# Patient Record
Sex: Female | Born: 1955 | Race: White | Hispanic: No | Marital: Married | Smoking: Never smoker
Health system: Southern US, Community
[De-identification: ages and names within clinical notes are randomized; demographics above are authoritative.]

## PROBLEM LIST (undated history)

## (undated) DIAGNOSIS — S43429A Sprain of unspecified rotator cuff capsule, initial encounter: Secondary | ICD-10-CM

## (undated) DIAGNOSIS — K852 Alcohol induced acute pancreatitis without necrosis or infection: Secondary | ICD-10-CM

## (undated) DIAGNOSIS — M7700 Medial epicondylitis, unspecified elbow: Secondary | ICD-10-CM

## (undated) DIAGNOSIS — M1990 Unspecified osteoarthritis, unspecified site: Secondary | ICD-10-CM

## (undated) DIAGNOSIS — E039 Hypothyroidism, unspecified: Secondary | ICD-10-CM

## (undated) DIAGNOSIS — E079 Disorder of thyroid, unspecified: Secondary | ICD-10-CM

## (undated) HISTORY — DX: Sprain of unspecified rotator cuff capsule, initial encounter: S43.429A

## (undated) HISTORY — PX: MOUTH SURGERY: SHX715

## (undated) HISTORY — DX: Alcohol induced acute pancreatitis without necrosis or infection: K85.20

## (undated) HISTORY — DX: Disorder of thyroid, unspecified: E07.9

## (undated) HISTORY — PX: ANKLE SURGERY: SHX546

---

## 1978-11-19 HISTORY — PX: WISDOM TOOTH EXTRACTION: SHX21

## 1990-11-19 HISTORY — PX: THYROIDECTOMY, PARTIAL: SHX18

## 1998-06-02 ENCOUNTER — Ambulatory Visit (HOSPITAL_COMMUNITY): Admission: RE | Admit: 1998-06-02 | Discharge: 1998-06-02 | Payer: Self-pay | Admitting: *Deleted

## 1999-03-29 ENCOUNTER — Other Ambulatory Visit: Admission: RE | Admit: 1999-03-29 | Discharge: 1999-03-29 | Payer: Self-pay | Admitting: Obstetrics and Gynecology

## 2001-09-25 ENCOUNTER — Encounter: Admission: RE | Admit: 2001-09-25 | Discharge: 2001-10-28 | Payer: Self-pay | Admitting: Sports Medicine

## 2001-12-24 ENCOUNTER — Encounter: Admission: RE | Admit: 2001-12-24 | Discharge: 2002-02-03 | Payer: Self-pay | Admitting: Sports Medicine

## 2002-01-21 ENCOUNTER — Other Ambulatory Visit: Admission: RE | Admit: 2002-01-21 | Discharge: 2002-01-21 | Payer: Self-pay | Admitting: Obstetrics and Gynecology

## 2003-01-07 ENCOUNTER — Encounter: Payer: Self-pay | Admitting: Obstetrics and Gynecology

## 2003-01-07 ENCOUNTER — Encounter: Admission: RE | Admit: 2003-01-07 | Discharge: 2003-01-07 | Payer: Self-pay | Admitting: Obstetrics and Gynecology

## 2005-01-02 ENCOUNTER — Encounter: Admission: RE | Admit: 2005-01-02 | Discharge: 2005-01-02 | Payer: Self-pay | Admitting: Obstetrics and Gynecology

## 2005-06-19 ENCOUNTER — Emergency Department (HOSPITAL_COMMUNITY): Admission: EM | Admit: 2005-06-19 | Discharge: 2005-06-19 | Payer: Self-pay | Admitting: Emergency Medicine

## 2005-07-02 ENCOUNTER — Inpatient Hospital Stay (HOSPITAL_COMMUNITY): Admission: RE | Admit: 2005-07-02 | Discharge: 2005-07-03 | Payer: Self-pay | Admitting: Orthopedic Surgery

## 2005-07-17 ENCOUNTER — Encounter: Admission: RE | Admit: 2005-07-17 | Discharge: 2005-10-15 | Payer: Self-pay | Admitting: Orthopedic Surgery

## 2006-04-02 ENCOUNTER — Encounter: Admission: RE | Admit: 2006-04-02 | Discharge: 2006-04-02 | Payer: Self-pay | Admitting: Obstetrics and Gynecology

## 2007-04-29 ENCOUNTER — Encounter: Admission: RE | Admit: 2007-04-29 | Discharge: 2007-04-29 | Payer: Self-pay | Admitting: Obstetrics and Gynecology

## 2008-05-04 ENCOUNTER — Encounter: Admission: RE | Admit: 2008-05-04 | Discharge: 2008-05-04 | Payer: Self-pay | Admitting: Obstetrics and Gynecology

## 2009-10-05 ENCOUNTER — Encounter (INDEPENDENT_AMBULATORY_CARE_PROVIDER_SITE_OTHER): Payer: Self-pay | Admitting: *Deleted

## 2009-10-06 ENCOUNTER — Ambulatory Visit: Payer: Self-pay | Admitting: Gastroenterology

## 2009-10-20 ENCOUNTER — Ambulatory Visit: Payer: Self-pay | Admitting: Gastroenterology

## 2009-11-01 ENCOUNTER — Encounter: Payer: Self-pay | Admitting: Gastroenterology

## 2010-11-19 DIAGNOSIS — M7700 Medial epicondylitis, unspecified elbow: Secondary | ICD-10-CM

## 2010-11-19 HISTORY — DX: Medial epicondylitis, unspecified elbow: M77.00

## 2011-04-06 NOTE — Op Note (Signed)
[name], Jimenez                ACCOUNT NO.:  [id number]   MEDICAL RECORD NO.:  [id number]          PATIENT TYPE:  INP   LOCATION:  5032                         FACILITY:  [facility]   PHYSICIAN:  Doralee Albino. Carola Frost, M.D. DATE OF BIRTH:  Mar 12, 1956   DATE OF PROCEDURE:  07/02/2005  DATE OF DISCHARGE:                                 OPERATIVE REPORT   PREOPERATIVE DIAGNOSIS:  Left ankle fracture dislocation with bimalleolar  equivalent.   POSTOPERATIVE DIAGNOSES:  1.  Left ankle fracture dislocation with bimalleolar equivalent.  2.  Ruptured syndesmosis.   PROCEDURES:  1.  Open reduction of left ankle.  2.  Open reduction and internal fixation, left lateral malleolus.  3.  Open reduction and internal fixation, left syndesmosis.   SURGEON:  Myrene Galas, M.D.   ASSISTANT:  Peter Garter, PA.   ANESTHESIA:  General.   COMPLICATIONS:  None.   TOURNIQUET:  None.   ESTIMATED BLOOD LOSS:  Less than 50 mL.   DISPOSITION:  To PACU.   CONDITION:  Stable.   BRIEF SUMMARY OF INDICATIONS FOR PROCEDURE:  [name] is a 55 year old  white female who sustained an injury to her left ankle walking down the  steps to work on June 19, 2005, resulting in immediate pain, deformity and  swelling.  She was subsequently reduced by closed methods in the emergency  room by an emergency department physician with persistent lateral  subluxation, but nonetheless, enough of a reduction that the soft tissues on  her ankle were able to undergo resolution of swelling.  She was referred to  our clinic as an outpatient where she was seen and deferred a further  reduction procedure.  At that time, her swelling was too significant to  enable early return to the OR and after approximately a 10-day reprieve for  soft tissue swelling resolution, she wished to proceed with surgical  fixation and open reduction.  She understood the risks to include nerve  injury, vessel injury, arthritis, delayed  union, nonunion and infection, and  possible need for further surgery in addition to arthritis, decreased range  of motion and thromboembolic complications.   BRIEF DESCRIPTION OF PROCEDURE:  [name] was administered a gram of Ancef  preoperatively and taken to the operating room where her left lower  extremity was prepped and draped in usual sterile fashion.  A tourniquet was  placed about her  left thigh, but not inflated during the procedure.  A  standard 6-cm incision was made over the lateral malleolus with careful  dissection proximally to avoid injury to the superficial peroneal nerve,  which was identified and retracted anteriorly.  Distally, the fascia was  split and the soft tissues elevated off the fibula while leaving intact the  periosteum.  The fracture site was then cleaned with lavage and curette, and  then reduction maneuver performed which consisted of longitudinal traction  and internal rotation of the ankle.  A lobster claw was used to hold and  maintain the reduction while fluoro-images were obtained, showing anatomic  restoration.  Two anterior-to-posterior lag screws were then placed  and a  neutralization plate along the lateral aspect of the distal fibula.  It was  secured with 6 screws, the most distal 2 being cancellus.  This resulted in  apparent anatomic restoration of the tibiotalar joint and fibula.  AP  lateral and mortise views were then obtained as well as a mortise external  rotation stress view.  Under this live fluoroscopic examination, syndesmotic  widening was clearly visible.  Consequently, one of the cortical screws was  withdrawn and additional cortex drilled and then a 40-mm cortical screw  inserted.  This resulted in restoration of the syndesmosis and a stress view  demonstrate any further motion.  Final AP lateral and mortise images were  then obtained.  The wound irrigated and closed with 2-0 and staples, and  then a sterile dressing and  posterior and stirrup splint applied.  The  patient was awakened from anesthesia and transported to PACU in stable  condition.  At the conclusion of the case, all counts were correct.   PROGNOSIS:  [name] prognosis with this injury is favorable.  She was  able to undergo apparent anatomic restoration of her fibular length,  rotation and restoration over her syndesmosis.  She is at risk for  subsequent hardware loosening or breakage, given the syndesmotic fixation.  I did discuss with her preoperatively the possibilities for treatment  including allowing the screw to break, loosen and electively withdrawing it  at 3-4 weeks.  She will be on DVT prophylaxis with Lovenox while in the  hospital and transitioned to a baby aspirin upon discharge.  She will be  strictly non-weightbearing for the first 6 weeks and then be allowed  graduated weightbearing thereafter.  She will not have any motion  restrictions after her wound heals.      Doralee Albino. Carola Frost, M.D.  Electronically Signed     [facility]MED[facility]  D:  07/02/2005  T:  07/03/2005  Job:  [id number]

## 2011-06-11 ENCOUNTER — Other Ambulatory Visit: Payer: Self-pay | Admitting: Obstetrics and Gynecology

## 2011-06-11 DIAGNOSIS — Z1231 Encounter for screening mammogram for malignant neoplasm of breast: Secondary | ICD-10-CM

## 2011-06-22 ENCOUNTER — Ambulatory Visit
Admission: RE | Admit: 2011-06-22 | Discharge: 2011-06-22 | Disposition: A | Discharge status: Home / Self Care | Payer: BC Managed Care – PPO | Source: Ambulatory Visit | Attending: Obstetrics and Gynecology | Admitting: Obstetrics and Gynecology

## 2011-06-22 DIAGNOSIS — Z1231 Encounter for screening mammogram for malignant neoplasm of breast: Secondary | ICD-10-CM

## 2011-06-26 ENCOUNTER — Other Ambulatory Visit: Payer: Self-pay | Admitting: Obstetrics and Gynecology

## 2011-06-26 DIAGNOSIS — R928 Other abnormal and inconclusive findings on diagnostic imaging of breast: Secondary | ICD-10-CM

## 2011-07-04 ENCOUNTER — Ambulatory Visit
Admission: RE | Admit: 2011-07-04 | Discharge: 2011-07-04 | Disposition: A | Discharge status: Home / Self Care | Payer: BC Managed Care – PPO | Source: Ambulatory Visit | Attending: Obstetrics and Gynecology | Admitting: Obstetrics and Gynecology

## 2011-07-04 DIAGNOSIS — R928 Other abnormal and inconclusive findings on diagnostic imaging of breast: Secondary | ICD-10-CM

## 2011-07-27 ENCOUNTER — Other Ambulatory Visit: Payer: Self-pay | Admitting: Orthopedic Surgery

## 2011-07-27 DIAGNOSIS — M25579 Pain in unspecified ankle and joints of unspecified foot: Secondary | ICD-10-CM

## 2011-07-30 ENCOUNTER — Ambulatory Visit
Admission: RE | Admit: 2011-07-30 | Discharge: 2011-07-30 | Disposition: A | Discharge status: Home / Self Care | Payer: Worker's Compensation | Source: Ambulatory Visit | Attending: Orthopedic Surgery | Admitting: Orthopedic Surgery

## 2011-07-30 DIAGNOSIS — M25579 Pain in unspecified ankle and joints of unspecified foot: Secondary | ICD-10-CM

## 2012-06-05 ENCOUNTER — Other Ambulatory Visit: Payer: Self-pay | Admitting: Obstetrics and Gynecology

## 2012-06-05 DIAGNOSIS — Z1231 Encounter for screening mammogram for malignant neoplasm of breast: Secondary | ICD-10-CM

## 2012-07-16 ENCOUNTER — Ambulatory Visit: Payer: Worker's Compensation

## 2012-07-22 ENCOUNTER — Ambulatory Visit
Admission: RE | Admit: 2012-07-22 | Discharge: 2012-07-22 | Disposition: A | Discharge status: Home / Self Care | Payer: BC Managed Care – PPO | Source: Ambulatory Visit | Attending: Obstetrics and Gynecology | Admitting: Obstetrics and Gynecology

## 2012-07-22 DIAGNOSIS — Z1231 Encounter for screening mammogram for malignant neoplasm of breast: Secondary | ICD-10-CM

## 2013-03-12 ENCOUNTER — Other Ambulatory Visit: Payer: Self-pay | Admitting: Surgery

## 2013-06-29 ENCOUNTER — Other Ambulatory Visit: Payer: Self-pay

## 2013-06-29 DIAGNOSIS — Z1231 Encounter for screening mammogram for malignant neoplasm of breast: Secondary | ICD-10-CM

## 2013-07-14 ENCOUNTER — Other Ambulatory Visit: Payer: Self-pay | Admitting: Orthopaedic Surgery

## 2013-07-14 DIAGNOSIS — M25552 Pain in left hip: Secondary | ICD-10-CM

## 2013-07-14 DIAGNOSIS — R531 Weakness: Secondary | ICD-10-CM

## 2013-07-15 ENCOUNTER — Ambulatory Visit
Admission: RE | Admit: 2013-07-15 | Discharge: 2013-07-15 | Disposition: A | Discharge status: Home / Self Care | Payer: BC Managed Care – PPO | Source: Ambulatory Visit | Attending: Orthopaedic Surgery | Admitting: Orthopaedic Surgery

## 2013-07-15 DIAGNOSIS — R531 Weakness: Secondary | ICD-10-CM

## 2013-07-15 DIAGNOSIS — M25552 Pain in left hip: Secondary | ICD-10-CM

## 2013-07-29 ENCOUNTER — Ambulatory Visit: Payer: BC Managed Care – PPO

## 2013-08-12 ENCOUNTER — Ambulatory Visit
Admission: RE | Admit: 2013-08-12 | Discharge: 2013-08-12 | Disposition: A | Discharge status: Home / Self Care | Payer: BC Managed Care – PPO | Source: Ambulatory Visit

## 2013-08-12 DIAGNOSIS — Z1231 Encounter for screening mammogram for malignant neoplasm of breast: Secondary | ICD-10-CM

## 2013-11-19 HISTORY — PX: COLONOSCOPY: SHX174

## 2014-08-18 ENCOUNTER — Encounter: Payer: Self-pay | Admitting: Gastroenterology

## 2014-08-26 ENCOUNTER — Encounter: Payer: Self-pay | Admitting: Gastroenterology

## 2014-10-21 ENCOUNTER — Ambulatory Visit (AMBULATORY_SURGERY_CENTER): Payer: Self-pay

## 2014-10-21 VITALS — Ht 61.0 in | Wt 156.2 lb

## 2014-10-21 DIAGNOSIS — Z86010 Personal history of colonic polyps: Secondary | ICD-10-CM

## 2014-10-21 MED ORDER — MOVIPREP 100 G PO SOLR: ORAL | Status: DC

## 2014-10-21 NOTE — Progress Notes (Signed)
Per pt, no allergies to soy or egg products.Pt not taking any weight loss meds or using  O2 at home. 

## 2014-11-04 ENCOUNTER — Ambulatory Visit (AMBULATORY_SURGERY_CENTER): Payer: BC Managed Care – PPO | Admitting: Gastroenterology

## 2014-11-04 ENCOUNTER — Encounter: Payer: Self-pay | Admitting: Gastroenterology

## 2014-11-04 VITALS — BP 128/78 | HR 78 | Temp 98.8°F | Resp 26 | Ht 61.0 in | Wt 156.0 lb

## 2014-11-04 DIAGNOSIS — D125 Benign neoplasm of sigmoid colon: Secondary | ICD-10-CM

## 2014-11-04 DIAGNOSIS — Z860100 Personal history of colon polyps, unspecified: Secondary | ICD-10-CM

## 2014-11-04 DIAGNOSIS — K635 Polyp of colon: Secondary | ICD-10-CM

## 2014-11-04 MED ORDER — SODIUM CHLORIDE 0.9 % IV SOLN: 500.0000 mL | INTRAVENOUS | Status: DC

## 2014-11-04 NOTE — Patient Instructions (Signed)
Findings:  Polyps, Mild Diverticulosis Recommendations:  High fiber Diet, Repeat colonoscopy in 5 years.  YOU HAD AN ENDOSCOPIC PROCEDURE TODAY AT [facility]: Refer to the procedure report that was given to you for any specific questions about what was found during the examination.  If the procedure report does not answer your questions, please call your gastroenterologist to clarify.  If you requested that your care partner not be given the details of your procedure findings, then the procedure report has been included in a sealed envelope for you to review at your convenience later.  YOU SHOULD EXPECT: Some feelings of bloating in the abdomen. Passage of more gas than usual.  Walking can help get rid of the air that was put into your GI tract during the procedure and reduce the bloating. If you had a lower endoscopy (such as a colonoscopy or flexible sigmoidoscopy) you may notice spotting of blood in your stool or on the toilet paper. If you underwent a bowel prep for your procedure, then you may not have a normal bowel movement for a few days.  DIET: Your first meal following the procedure should be a light meal and then it is ok to progress to your normal diet.  A half-sandwich or bowl of soup is an example of a good first meal.  Heavy or fried foods are harder to digest and may make you feel nauseous or bloated.  Likewise meals heavy in dairy and vegetables can cause extra gas to form and this can also increase the bloating.  Drink plenty of fluids but you should avoid alcoholic beverages for 24 hours.  ACTIVITY: Your care partner should take you home directly after the procedure.  You should plan to take it easy, moving slowly for the rest of the day.  You can resume normal activity the day after the procedure however you should NOT DRIVE or use heavy machinery for 24 hours (because of the sedation medicines used during the test).    SYMPTOMS TO REPORT IMMEDIATELY: A  gastroenterologist can be reached at any hour.  During normal business hours, 8:30 AM to 5:00 PM Monday through Friday, call [phone].  After hours and on weekends, please call the GI answering service at [phone] who will take a message and have the physician on call contact you.   Following lower endoscopy (colonoscopy or flexible sigmoidoscopy):  Excessive amounts of blood in the stool  Significant tenderness or worsening of abdominal pains  Swelling of the abdomen that is new, acute  Fever of 100F or higher  Following upper endoscopy (EGD)  Vomiting of blood or coffee ground material  New chest pain or pain under the shoulder blades  Painful or persistently difficult swallowing  New shortness of breath  Fever of 100F or higher  Black, tarry-looking stoolsPlease follow all discharge instructions given to you by the recovery room nurse. If you have any questions or problems after discharge please call one of the numbers listed above. You will receive a phone call in the am to see how you are doing and answer any questions you may have. Thank you for choosing [facility] for your health care needs. FOLLOW UP: If any biopsies were taken you will be contacted by phone or by letter within the next 1-3 weeks.  Call your gastroenterologist if you have not heard about the biopsies in 3 weeks.  Our staff will call the home number listed on your records the next business day  following your procedure to check on you and address any questions or concerns that you may have at that time regarding the information given to you following your procedure. This is a courtesy call and so if there is no answer at the home number and we have not heard from you through the emergency physician on call, we will assume that you have returned to your regular daily activities without incident.  SIGNATURES/CONFIDENTIALITY: You and/or your care partner have signed paperwork which will be entered  into your electronic medical record.  These signatures attest to the fact that that the information above on your After Visit Summary has been reviewed and is understood.  Full responsibility of the confidentiality of this discharge information lies with you and/or your care-partner.

## 2014-11-04 NOTE — Progress Notes (Signed)
Called to room to assist during endoscopic procedure.  Patient ID and intended procedure confirmed with present staff. Received instructions for my participation in the procedure from the performing physician.  

## 2014-11-04 NOTE — Op Note (Signed)
[place]  [address] [place], [id number]   COLONOSCOPY PROCEDURE REPORT  PATIENT: [name], [name]  MR#: [id number] BIRTHDATE: 03/12/1956 , 71  yrs. old GENDER: female ENDOSCOPIST: Ladene Artist, MD, [facility] PROCEDURE DATE:  11/04/2014 PROCEDURE:   Colonoscopy with snare polypectomy First Screening Colonoscopy - Avg.  risk and is 50 yrs.  old or older - No.  Prior Negative Screening - Now for repeat screening. N/A  History of Adenoma - Now for follow-up colonoscopy & has been > or = to 3 yrs.  Yes hx of adenoma.  Has been 3 or more years since last colonoscopy.  Polyps Removed Today? Yes. ASA CLASS:   Class II INDICATIONS:surveillance colonoscopy based on a history of adenomatous colonic polyp(s). MEDICATIONS: Monitored anesthesia care and Propofol 350 mg IV DESCRIPTION OF PROCEDURE:   After the risks benefits and alternatives of the procedure were thoroughly explained, informed consent was obtained.  The digital rectal exam revealed no abnormalities of the rectum.   The LB [id number]  endoscope was introduced through the anus and advanced to the cecum, which was identified by both the appendix and ileocecal valve. No adverse events experienced.   The quality of the prep was excellent, using MoviPrep  The instrument was then slowly withdrawn as the colon was fully examined.  COLON FINDINGS: Two sessile polyps measuring 6 mm in size were found in the sigmoid colon.  A polypectomy was performed with a cold snare.  The resection was complete, the polyp tissue was completely retrieved and sent to histology.   There was mild diverticulosis noted in the sigmoid colon.   The examination was otherwise normal. Retroflexed views revealed no abnormalities. The time to cecum=2 minutes 23 seconds.  Withdrawal time=10 minutes 39 seconds.  The scope was withdrawn and the procedure completed. COMPLICATIONS: There were no immediate complications.  ENDOSCOPIC  IMPRESSION: 1.   Two sessile polyps in the sigmoid colon; polypectomy performed with a cold snare 2.   Mild diverticulosis in the sigmoid colon  RECOMMENDATIONS: 1.  Await pathology results 2.  High fiber diet with liberal fluid intake. 3.  Repeat Colonoscopy in 5 years.  eSigned:  Ladene Artist, MD, [facility] 11/04/2014 8:33 AM

## 2014-11-04 NOTE — Progress Notes (Signed)
A/ox3 pleased with MAC, report to Tracy W 

## 2014-11-05 ENCOUNTER — Telehealth: Payer: Self-pay | Admitting: *Deleted

## 2014-11-05 NOTE — Telephone Encounter (Signed)
Message left

## 2014-11-15 ENCOUNTER — Encounter: Payer: Self-pay | Admitting: Gastroenterology

## 2014-12-22 ENCOUNTER — Other Ambulatory Visit: Payer: Self-pay | Admitting: Physician Assistant

## 2014-12-22 DIAGNOSIS — M25561 Pain in right knee: Secondary | ICD-10-CM

## 2014-12-22 DIAGNOSIS — M25562 Pain in left knee: Secondary | ICD-10-CM

## 2014-12-28 ENCOUNTER — Ambulatory Visit
Admission: RE | Admit: 2014-12-28 | Discharge: 2014-12-28 | Disposition: A | Discharge status: Home / Self Care | Payer: BLUE CROSS/BLUE SHIELD | Source: Ambulatory Visit | Attending: Physician Assistant | Admitting: Physician Assistant

## 2014-12-28 DIAGNOSIS — M25562 Pain in left knee: Secondary | ICD-10-CM

## 2015-01-03 ENCOUNTER — Other Ambulatory Visit: Payer: Self-pay

## 2015-03-25 ENCOUNTER — Encounter: Payer: Self-pay | Admitting: Gastroenterology

## 2015-10-26 ENCOUNTER — Other Ambulatory Visit (HOSPITAL_COMMUNITY): Payer: Self-pay | Admitting: Orthopaedic Surgery

## 2015-10-26 NOTE — Patient Instructions (Addendum)
[name]  10/26/2015   Your procedure is scheduled on: Friday 11/04/15     Report to [facility]  Entrance take [facility]  elevators to 3rd floor to  [place] at   100pm  Call this number if you have problems the morning of surgery [phone]   Remember: ONLY 1 PERSON MAY GO WITH YOU TO SHORT STAY TO GET  READY MORNING OF YOUR SURGERY.  Do not eat food after midnite.  May have clear liquids from 12 midnite until 0830am morning of surgery then nothing by mouth.       Take these medicines the morning of surgery with A SIP OF WATER: Synthroid                                 You may not have any metal on your body including hair pins and              piercings  Do not wear jewelry, make-up, lotions, powders or perfumes, deodorant             Do not wear nail polish.  Do not shave  48 hours prior to surgery.                 Do not bring valuables to the hospital. [place].  Contacts, dentures or bridgework may not be worn into surgery.  Leave suitcase in the car. After surgery it may be brought to your room.         Special Instructions: deep breathing exercises, leg exercises               Please read over the following fact sheets you were given: _____________________________________________________________________             [facility] - Preparing for Surgery Before surgery, you can play an important role.  Because skin is not sterile, your skin needs to be as free of germs as possible.  You can reduce the number of germs on your skin by washing with CHG (chlorahexidine gluconate) soap before surgery.  CHG is an antiseptic cleaner which kills germs and bonds with the skin to continue killing germs even after washing. Please DO NOT use if you have an allergy to CHG or antibacterial soaps.  If your skin becomes reddened/irritated stop using the CHG and inform your nurse when you arrive at  Short Stay. Do not shave (including legs and underarms) for at least 48 hours prior to the first CHG shower.  You may shave your face/neck. Please follow these instructions carefully:  1.  Shower with CHG Soap the night before surgery and the  morning of Surgery.  2.  If you choose to wash your hair, wash your hair first as usual with your  normal  shampoo.  3.  After you shampoo, rinse your hair and body thoroughly to remove the  shampoo.                           4.  Use CHG as you would any other liquid soap.  You can apply chg directly  to the skin and wash  Gently with a scrungie or clean washcloth.  5.  Apply the CHG Soap to your body ONLY FROM THE NECK DOWN.   Do not use on face/ open                           Wound or open sores. Avoid contact with eyes, ears mouth and genitals (private parts).                       Wash face,  Genitals (private parts) with your normal soap.             6.  Wash thoroughly, paying special attention to the area where your surgery  will be performed.  7.  Thoroughly rinse your body with warm water from the neck down.  8.  DO NOT shower/wash with your normal soap after using and rinsing off  the CHG Soap.                9.  Pat yourself dry with a clean towel.            10.  Wear clean pajamas.            11.  Place clean sheets on your bed the night of your first shower and do not  sleep with pets. Day of Surgery : Do not apply any lotions/deodorants the morning of surgery.  Please wear clean clothes to the hospital/surgery center.  FAILURE TO FOLLOW THESE INSTRUCTIONS MAY RESULT IN THE CANCELLATION OF YOUR SURGERY PATIENT SIGNATURE_________________________________  NURSE SIGNATURE__________________________________  ________________________________________________________________________  WHAT IS A BLOOD TRANSFUSION? Blood Transfusion Information  A transfusion is the replacement of blood or some of its parts. Blood is made up  of multiple cells which provide different functions.  Red blood cells carry oxygen and are used for blood loss replacement.  White blood cells fight against infection.  Platelets control bleeding.  Plasma helps clot blood.  Other blood products are available for specialized needs, such as hemophilia or other clotting disorders. BEFORE THE TRANSFUSION  Who gives blood for transfusions?   Healthy volunteers who are fully evaluated to make sure their blood is safe. This is blood bank blood. Transfusion therapy is the safest it has ever been in the practice of medicine. Before blood is taken from a donor, a complete history is taken to make sure that person has no history of diseases nor engages in risky social behavior (examples are intravenous drug use or sexual activity with multiple partners). The donor's travel history is screened to minimize risk of transmitting infections, such as malaria. The donated blood is tested for signs of infectious diseases, such as HIV and hepatitis. The blood is then tested to be sure it is compatible with you in order to minimize the chance of a transfusion reaction. If you or a relative donates blood, this is often done in anticipation of surgery and is not appropriate for emergency situations. It takes many days to process the donated blood. RISKS AND COMPLICATIONS Although transfusion therapy is very safe and saves many lives, the main dangers of transfusion include:   Getting an infectious disease.  Developing a transfusion reaction. This is an allergic reaction to something in the blood you were given. Every precaution is taken to prevent this. The decision to have a blood transfusion has been considered carefully by your caregiver before blood is given. Blood is not given unless the benefits outweigh  the risks. AFTER THE TRANSFUSION  Right after receiving a blood transfusion, you will usually feel much better and more energetic. This is especially true  if your red blood cells have gotten low (anemic). The transfusion raises the level of the red blood cells which carry oxygen, and this usually causes an energy increase.  The nurse administering the transfusion will monitor you carefully for complications. HOME CARE INSTRUCTIONS  No special instructions are needed after a transfusion. You may find your energy is better. Speak with your caregiver about any limitations on activity for underlying diseases you may have. SEEK MEDICAL CARE IF:   Your condition is not improving after your transfusion.  You develop redness or irritation at the intravenous (IV) site. SEEK IMMEDIATE MEDICAL CARE IF:  Any of the following symptoms occur over the next 12 hours:  Shaking chills.  You have a temperature by mouth above 102 F (38.9 C), not controlled by medicine.  Chest, back, or muscle pain.  People around you feel you are not acting correctly or are confused.  Shortness of breath or difficulty breathing.  Dizziness and fainting.  You get a rash or develop hives.  You have a decrease in urine output.  Your urine turns a dark color or changes to pink, red, or brown. Any of the following symptoms occur over the next 10 days:  You have a temperature by mouth above 102 F (38.9 C), not controlled by medicine.  Shortness of breath.  Weakness after normal activity.  The white part of the eye turns yellow (jaundice).  You have a decrease in the amount of urine or are urinating less often.  Your urine turns a dark color or changes to pink, red, or brown. Document Released: 11/02/2000 Document Revised: 01/28/2012 Document Reviewed: 06/21/2008 ExitCare Patient Information 2014 ExitCare, [place].  _______________________________________________________________________   CLEAR LIQUID DIET   Foods Allowed                                                                     Foods Excluded  Coffee and tea, regular and decaf                              liquids that you cannot  Plain Jell-O in any flavor                                             see through such as: Fruit ices (not with fruit pulp)                                     milk, soups, orange juice  Iced Popsicles                                    All solid food Carbonated beverages, regular and diet  Cranberry, grape and apple juices Sports drinks like Gatorade Lightly seasoned clear broth or consume(fat free) Sugar, honey syrup  Sample Menu Breakfast                                Lunch                                     Supper Cranberry juice                    Beef broth                            Chicken broth Jell-O                                     Grape juice                           Apple juice Coffee or tea                        Jell-O                                      Popsicle                                                Coffee or tea                        Coffee or tea  _____________________________________________________________________

## 2015-10-28 ENCOUNTER — Encounter (HOSPITAL_COMMUNITY): Payer: Self-pay

## 2015-10-28 ENCOUNTER — Encounter (HOSPITAL_COMMUNITY)
Admission: RE | Admit: 2015-10-28 | Discharge: 2015-10-28 | Disposition: A | Discharge status: Home / Self Care | Payer: BLUE CROSS/BLUE SHIELD | Source: Ambulatory Visit | Attending: Orthopaedic Surgery | Admitting: Orthopaedic Surgery

## 2015-10-28 DIAGNOSIS — Z01812 Encounter for preprocedural laboratory examination: Secondary | ICD-10-CM | POA: Insufficient documentation

## 2015-10-28 HISTORY — DX: Medial epicondylitis, unspecified elbow: M77.00

## 2015-10-28 HISTORY — DX: Hypothyroidism, unspecified: E03.9

## 2015-10-28 HISTORY — DX: Unspecified osteoarthritis, unspecified site: M19.90

## 2015-10-28 LAB — CBC
HEMATOCRIT: 41.6 % (ref 36.0–46.0)
HEMOGLOBIN: 13.7 g/dL (ref 12.0–15.0)
MCH: 30.9 pg (ref 26.0–34.0)
MCHC: 32.9 g/dL (ref 30.0–36.0)
MCV: 93.7 fL (ref 78.0–100.0)
Platelets: 218 10*3/uL (ref 150–400)
RBC: 4.44 MIL/uL (ref 3.87–5.11)
RDW: 12.2 % (ref 11.5–15.5)
WBC: 4.7 10*3/uL (ref 4.0–10.5)

## 2015-10-28 LAB — PROTIME-INR
INR: 1.03 (ref 0.00–1.49)
Prothrombin Time: 13.7 seconds (ref 11.6–15.2)

## 2015-10-28 LAB — BASIC METABOLIC PANEL
ANION GAP: 7 (ref 5–15)
BUN: 11 mg/dL (ref 6–20)
CHLORIDE: 104 mmol/L (ref 101–111)
CO2: 30 mmol/L (ref 22–32)
Calcium: 9.9 mg/dL (ref 8.9–10.3)
Creatinine, Ser: 0.74 mg/dL (ref 0.44–1.00)
GFR calc Af Amer: >60 mL/min (ref >60)
GLUCOSE: 129 mg/dL — AB (ref 65–99)
POTASSIUM: 4.6 mmol/L (ref 3.5–5.1)
Sodium: 141 mmol/L (ref 135–145)

## 2015-10-28 LAB — SURGICAL PCR SCREEN
MRSA, PCR: NEGATIVE
STAPHYLOCOCCUS AUREUS: POSITIVE — AB

## 2015-10-28 LAB — APTT: aPTT: 26 seconds (ref 24–37)

## 2015-10-28 LAB — TYPE AND SCREEN
ABO/RH(D): B POS
Antibody Screen: NEGATIVE

## 2015-10-28 LAB — ABO/RH: ABO/RH(D): B POS

## 2015-10-28 NOTE — Progress Notes (Signed)
Pt positive for staph. Called in prescription, notified pt, notified Dr. Ninfa Linden.

## 2015-11-03 ENCOUNTER — Encounter (HOSPITAL_COMMUNITY): Payer: Self-pay | Admitting: Certified Registered Nurse Anesthetist

## 2015-11-04 ENCOUNTER — Ambulatory Visit (HOSPITAL_COMMUNITY)
Admission: RE | Admit: 2015-11-04 | Discharge: 2015-11-04 | Disposition: A | Discharge status: Home / Self Care | Payer: BLUE CROSS/BLUE SHIELD | Source: Ambulatory Visit | Attending: Orthopaedic Surgery | Admitting: Orthopaedic Surgery

## 2015-11-04 ENCOUNTER — Encounter (HOSPITAL_COMMUNITY): Payer: Self-pay | Admitting: *Deleted

## 2015-11-04 ENCOUNTER — Encounter (HOSPITAL_COMMUNITY)
Admission: RE | Disposition: A | Discharge status: Home / Self Care | Payer: Self-pay | Source: Ambulatory Visit | Attending: Orthopaedic Surgery

## 2015-11-04 DIAGNOSIS — M179 Osteoarthritis of knee, unspecified: Secondary | ICD-10-CM | POA: Insufficient documentation

## 2015-11-04 DIAGNOSIS — E039 Hypothyroidism, unspecified: Secondary | ICD-10-CM | POA: Insufficient documentation

## 2015-11-04 DIAGNOSIS — Z79899 Other long term (current) drug therapy: Secondary | ICD-10-CM | POA: Insufficient documentation

## 2015-11-04 DIAGNOSIS — M1712 Unilateral primary osteoarthritis, left knee: Secondary | ICD-10-CM

## 2015-11-04 DIAGNOSIS — Z539 Procedure and treatment not carried out, unspecified reason: Secondary | ICD-10-CM | POA: Insufficient documentation

## 2015-11-04 SURGERY — ARTHROPLASTY, KNEE, UNICOMPARTMENTAL
Anesthesia: General | Site: Knee | Laterality: Left

## 2015-11-04 MED ORDER — CEFAZOLIN SODIUM-DEXTROSE 2-3 GM-% IV SOLR: 2.0000 g | INTRAVENOUS | Status: DC

## 2015-11-04 NOTE — Anesthesia Preprocedure Evaluation (Deleted)
Anesthesia Evaluation  Patient identified by MRN, date of birth, ID band Patient awake    Reviewed: Allergy & Precautions, NPO status , Patient's Chart, lab work & pertinent test results  Airway Mallampati: II   Neck ROM: full    Dental   Pulmonary neg pulmonary ROS,    breath sounds clear to auscultation       Cardiovascular negative cardio ROS   Rhythm:regular Rate:Normal     Neuro/Psych    GI/Hepatic   Endo/Other  Hypothyroidism   Renal/GU      Musculoskeletal  (+) Arthritis ,   Abdominal   Peds  Hematology   Anesthesia Other Findings   Reproductive/Obstetrics                             Anesthesia Physical Anesthesia Plan  ASA: II  Anesthesia Plan: MAC and Spinal   Post-op Pain Management:    Induction: Intravenous  Airway Management Planned: Simple Face Mask  Additional Equipment:   Intra-op Plan:   Post-operative Plan:   Informed Consent: I have reviewed the patients History and Physical, chart, labs and discussed the procedure including the risks, benefits and alternatives for the proposed anesthesia with the patient or authorized representative who has indicated his/her understanding and acceptance.     Plan Discussed with: CRNA, Anesthesiologist and Surgeon  Anesthesia Plan Comments:         Anesthesia Quick Evaluation

## 2015-11-04 NOTE — H&P (Signed)
TOTAL KNEE ADMISSION H&P  Patient is being admitted for left partial knee arthroplasty.  Subjective:  Chief Complaint:left knee pain.  HPI: [name], 59 y.o. female, has a history of pain and functional disability in the left knee due to arthritis and has failed non-surgical conservative treatments for greater than 12 weeks to includeNSAID's and/or analgesics, corticosteriod injections, viscosupplementation injections, flexibility and strengthening excercises and activity modification.  Onset of symptoms was gradual, starting 4 years ago with gradually worsening course since that time. The patient noted prior procedures on the knee to include  arthroscopy on the left knee(s).  Patient currently rates pain in the left knee(s) at 10 out of 10 with activity. Patient has night pain, worsening of pain with activity and weight bearing, pain that interferes with activities of daily living, pain with passive range of motion, crepitus and joint swelling.  Patient has evidence of subchondral sclerosis, periarticular osteophytes and joint space narrowing of the medial compartment by imaging studies. There is no active infection.  Patient Active Problem List   Diagnosis Date Noted  . Osteoarthritis of left knee medial compartment 11/04/2015   Past Medical History  Diagnosis Date  . Thyroid disease   . Rotator cuff sprain   . Hypothyroidism   . Arthritis   . Golfer's elbow 2012    Past Surgical History  Procedure Laterality Date  . Cesarean section  1978     1 time  . Ankle surgery      screw in left ankle/ 2 surgeries  . Thyroidectomy, partial  1992  . Wisdom tooth extraction  1980  . Mouth surgery      No prescriptions prior to admission   No Known Allergies  Social History  Substance Use Topics  . Smoking status: Never Smoker   . Smokeless tobacco: Never Used  . Alcohol Use: 4.2 oz/week    7 Standard drinks or equivalent per week     Comment: socially, weekends    Family  History  Problem Relation Age of Onset  . Diverticulitis Mother   . Diverticulitis Son      Review of Systems  Musculoskeletal: Positive for joint pain.  All other systems reviewed and are negative.   Objective:  Physical Exam  Constitutional: She is oriented to person, place, and time. She appears well-developed and well-nourished.  HENT:  Head: Normocephalic and atraumatic.  Eyes: EOM are normal. Pupils are equal, round, and reactive to light.  Neck: Normal range of motion. Neck supple.  Cardiovascular: Normal rate and regular rhythm.   Respiratory: Effort normal and breath sounds normal.  GI: Soft. Bowel sounds are normal.  Musculoskeletal:       Left knee: She exhibits decreased range of motion, swelling and effusion. Tenderness found. Medial joint line tenderness noted.  Neurological: She is alert and oriented to person, place, and time.  Skin: Skin is warm and dry.  Psychiatric: She has a normal mood and affect.    Vital signs in last 24 hours:    Labs:   Estimated body mass index is 29.30 kg/(m^2) as calculated from the following:   Height as of 11/04/14: 5\' 1"  (1.549 m).   Weight as of 12/28/14: 70.308 kg (155 lb).   Imaging Review Plain radiographs demonstrate moderate degenerative joint disease of the left knee medial compartment. The overall alignment ismild varus. The bone quality appears to be excellent for age and reported activity level.  Assessment/Plan:  End stage arthritis, left knee medial compartment  The  patient history, physical examination, clinical judgment of the provider and imaging studies are consistent with end stage degenerative joint disease of the left knee(s) and partial knee arthroplasty is deemed medically necessary. The treatment options including medical management, injection therapy arthroscopy and arthroplasty were discussed at length. The risks and benefits of partial knee arthroplasty were presented and reviewed. The risks due to  aseptic loosening, infection, stiffness, patella tracking problems, thromboembolic complications and other imponderables were discussed. The patient acknowledged the explanation, agreed to proceed with the plan and consent was signed. Patient is being admitted for inpatient treatment for surgery, pain control, PT, OT, prophylactic antibiotics, VTE prophylaxis, progressive ambulation and ADL's and discharge planning. The patient is planning to be discharged home with home health services

## 2015-11-07 ENCOUNTER — Encounter (HOSPITAL_COMMUNITY): Payer: Self-pay | Admitting: *Deleted

## 2015-11-07 ENCOUNTER — Ambulatory Visit: Payer: BLUE CROSS/BLUE SHIELD | Admitting: Physical Therapy

## 2015-11-07 ENCOUNTER — Other Ambulatory Visit (HOSPITAL_COMMUNITY): Payer: Self-pay | Admitting: Orthopaedic Surgery

## 2015-11-07 DIAGNOSIS — M179 Osteoarthritis of knee, unspecified: Secondary | ICD-10-CM | POA: Diagnosis not present

## 2015-11-08 ENCOUNTER — Other Ambulatory Visit (HOSPITAL_COMMUNITY): Payer: Self-pay | Admitting: *Deleted

## 2015-11-09 ENCOUNTER — Ambulatory Visit: Payer: BLUE CROSS/BLUE SHIELD | Admitting: Physical Therapy

## 2015-11-09 ENCOUNTER — Observation Stay (HOSPITAL_COMMUNITY): Payer: BLUE CROSS/BLUE SHIELD

## 2015-11-09 ENCOUNTER — Ambulatory Visit (HOSPITAL_COMMUNITY): Payer: BLUE CROSS/BLUE SHIELD | Admitting: Certified Registered Nurse Anesthetist

## 2015-11-09 ENCOUNTER — Encounter (HOSPITAL_COMMUNITY)
Admission: RE | Disposition: A | Discharge status: Home / Self Care | Payer: Self-pay | Source: Ambulatory Visit | Attending: Orthopaedic Surgery

## 2015-11-09 ENCOUNTER — Observation Stay (HOSPITAL_COMMUNITY)
Admission: RE | Admit: 2015-11-09 | Discharge: 2015-11-10 | Disposition: A | Discharge status: Home / Self Care | Payer: BLUE CROSS/BLUE SHIELD | Source: Ambulatory Visit | Attending: Orthopaedic Surgery | Admitting: Orthopaedic Surgery

## 2015-11-09 ENCOUNTER — Encounter (HOSPITAL_COMMUNITY): Payer: Self-pay

## 2015-11-09 DIAGNOSIS — E669 Obesity, unspecified: Secondary | ICD-10-CM | POA: Diagnosis not present

## 2015-11-09 DIAGNOSIS — M179 Osteoarthritis of knee, unspecified: Secondary | ICD-10-CM | POA: Diagnosis not present

## 2015-11-09 DIAGNOSIS — E039 Hypothyroidism, unspecified: Secondary | ICD-10-CM | POA: Insufficient documentation

## 2015-11-09 DIAGNOSIS — Z6830 Body mass index (BMI) 30.0-30.9, adult: Secondary | ICD-10-CM | POA: Insufficient documentation

## 2015-11-09 DIAGNOSIS — Z96652 Presence of left artificial knee joint: Secondary | ICD-10-CM

## 2015-11-09 DIAGNOSIS — M1712 Unilateral primary osteoarthritis, left knee: Secondary | ICD-10-CM | POA: Diagnosis present

## 2015-11-09 HISTORY — PX: PARTIAL KNEE ARTHROPLASTY: SHX2174

## 2015-11-09 LAB — TYPE AND SCREEN
ABO/RH(D): B POS
ANTIBODY SCREEN: NEGATIVE

## 2015-11-09 SURGERY — ARTHROPLASTY, KNEE, UNICOMPARTMENTAL
Anesthesia: Spinal | Site: Knee | Laterality: Left

## 2015-11-09 MED ORDER — MIDAZOLAM HCL 5 MG/5ML IJ SOLN
INTRAMUSCULAR | Status: DC | PRN
  Administered 2015-11-09: 2 mg via INTRAVENOUS

## 2015-11-09 MED ORDER — OXYCODONE HCL 5 MG PO TABS
5.0000 mg | ORAL_TABLET | ORAL | Status: DC | PRN
  Administered 2015-11-09 – 2015-11-10 (×6): 10 mg via ORAL
  Filled 2015-11-09 (×6): qty 2

## 2015-11-09 MED ORDER — CEFAZOLIN SODIUM-DEXTROSE 2-3 GM-% IV SOLR
INTRAVENOUS | Status: AC
  Filled 2015-11-09: qty 50

## 2015-11-09 MED ORDER — HYDROMORPHONE HCL 1 MG/ML IJ SOLN: 0.2500 mg | INTRAMUSCULAR | Status: DC | PRN

## 2015-11-09 MED ORDER — PROPOFOL 10 MG/ML IV BOLUS
INTRAVENOUS | Status: AC
  Filled 2015-11-09: qty 20

## 2015-11-09 MED ORDER — 0.9 % SODIUM CHLORIDE (POUR BTL) OPTIME
TOPICAL | Status: DC | PRN
  Administered 2015-11-09: 1000 mL

## 2015-11-09 MED ORDER — ACETAMINOPHEN 325 MG PO TABS: 650.0000 mg | ORAL_TABLET | Freq: Four times a day (QID) | ORAL | Status: DC | PRN

## 2015-11-09 MED ORDER — ACETAMINOPHEN 650 MG RE SUPP: 650.0000 mg | Freq: Four times a day (QID) | RECTAL | Status: DC | PRN

## 2015-11-09 MED ORDER — ONDANSETRON HCL 4 MG/2ML IJ SOLN
INTRAMUSCULAR | Status: DC | PRN
  Administered 2015-11-09: 4 mg via INTRAVENOUS

## 2015-11-09 MED ORDER — EPHEDRINE SULFATE 50 MG/ML IJ SOLN
INTRAMUSCULAR | Status: AC
  Filled 2015-11-09: qty 1

## 2015-11-09 MED ORDER — ASPIRIN EC 325 MG PO TBEC
325.0000 mg | DELAYED_RELEASE_TABLET | Freq: Two times a day (BID) | ORAL | Status: DC
  Administered 2015-11-09 – 2015-11-10 (×2): 325 mg via ORAL
  Filled 2015-11-09 (×4): qty 1

## 2015-11-09 MED ORDER — FENTANYL CITRATE (PF) 100 MCG/2ML IJ SOLN
INTRAMUSCULAR | Status: DC | PRN
  Administered 2015-11-09 (×2): 50 ug via INTRAVENOUS

## 2015-11-09 MED ORDER — ONDANSETRON HCL 4 MG/2ML IJ SOLN: 4.0000 mg | Freq: Four times a day (QID) | INTRAMUSCULAR | Status: DC | PRN

## 2015-11-09 MED ORDER — EPHEDRINE SULFATE 50 MG/ML IJ SOLN
INTRAMUSCULAR | Status: DC | PRN
  Administered 2015-11-09 (×2): 10 mg via INTRAVENOUS

## 2015-11-09 MED ORDER — MIDAZOLAM HCL 2 MG/2ML IJ SOLN
INTRAMUSCULAR | Status: AC
  Filled 2015-11-09: qty 2

## 2015-11-09 MED ORDER — HYDROMORPHONE HCL 1 MG/ML IJ SOLN
1.0000 mg | INTRAMUSCULAR | Status: DC | PRN
  Administered 2015-11-09 – 2015-11-10 (×4): 1 mg via INTRAVENOUS
  Filled 2015-11-09 (×4): qty 1

## 2015-11-09 MED ORDER — PHENOL 1.4 % MT LIQD
1.0000 | OROMUCOSAL | Status: DC | PRN
  Filled 2015-11-09: qty 177

## 2015-11-09 MED ORDER — METHOCARBAMOL 500 MG PO TABS
500.0000 mg | ORAL_TABLET | Freq: Four times a day (QID) | ORAL | Status: DC | PRN
  Administered 2015-11-10: 500 mg via ORAL
  Filled 2015-11-09 (×2): qty 1

## 2015-11-09 MED ORDER — OXYCODONE HCL 5 MG/5ML PO SOLN: 5.0000 mg | Freq: Once | ORAL | Status: DC | PRN

## 2015-11-09 MED ORDER — SODIUM CHLORIDE 0.9 % IJ SOLN
INTRAMUSCULAR | Status: AC
  Filled 2015-11-09: qty 10

## 2015-11-09 MED ORDER — DEXAMETHASONE SODIUM PHOSPHATE 10 MG/ML IJ SOLN
INTRAMUSCULAR | Status: AC
  Filled 2015-11-09: qty 1

## 2015-11-09 MED ORDER — PROPOFOL 10 MG/ML IV BOLUS
INTRAVENOUS | Status: AC
Start: 2015-11-09 — End: 2015-11-09
  Filled 2015-11-09: qty 20

## 2015-11-09 MED ORDER — KETOROLAC TROMETHAMINE 15 MG/ML IJ SOLN
7.5000 mg | Freq: Four times a day (QID) | INTRAMUSCULAR | Status: AC
  Administered 2015-11-09 (×2): 7.5 mg via INTRAVENOUS
  Filled 2015-11-09 (×3): qty 1

## 2015-11-09 MED ORDER — ONDANSETRON HCL 4 MG/2ML IJ SOLN
INTRAMUSCULAR | Status: AC
  Filled 2015-11-09: qty 2

## 2015-11-09 MED ORDER — METOCLOPRAMIDE HCL 10 MG PO TABS: 5.0000 mg | ORAL_TABLET | Freq: Three times a day (TID) | ORAL | Status: DC | PRN

## 2015-11-09 MED ORDER — PROPOFOL 500 MG/50ML IV EMUL
INTRAVENOUS | Status: DC | PRN
  Administered 2015-11-09: 100 ug/kg/min via INTRAVENOUS

## 2015-11-09 MED ORDER — CEFAZOLIN SODIUM 1-5 GM-% IV SOLN
1.0000 g | Freq: Four times a day (QID) | INTRAVENOUS | Status: AC
  Administered 2015-11-09 (×2): 1 g via INTRAVENOUS
  Filled 2015-11-09 (×2): qty 50

## 2015-11-09 MED ORDER — METOCLOPRAMIDE HCL 5 MG/ML IJ SOLN: 5.0000 mg | Freq: Three times a day (TID) | INTRAMUSCULAR | Status: DC | PRN

## 2015-11-09 MED ORDER — DIPHENHYDRAMINE HCL 12.5 MG/5ML PO ELIX: 12.5000 mg | ORAL_SOLUTION | ORAL | Status: DC | PRN

## 2015-11-09 MED ORDER — BUPIVACAINE IN DEXTROSE 0.75-8.25 % IT SOLN
INTRATHECAL | Status: DC | PRN
  Administered 2015-11-09: 1.6 mL via INTRATHECAL

## 2015-11-09 MED ORDER — DEXAMETHASONE SODIUM PHOSPHATE 10 MG/ML IJ SOLN
INTRAMUSCULAR | Status: DC | PRN
  Administered 2015-11-09: 10 mg via INTRAVENOUS

## 2015-11-09 MED ORDER — FENTANYL CITRATE (PF) 100 MCG/2ML IJ SOLN
INTRAMUSCULAR | Status: AC
  Filled 2015-11-09: qty 2

## 2015-11-09 MED ORDER — ZOLPIDEM TARTRATE 5 MG PO TABS: 5.0000 mg | ORAL_TABLET | Freq: Every evening | ORAL | Status: DC | PRN

## 2015-11-09 MED ORDER — OXYCODONE HCL 5 MG PO TABS: 5.0000 mg | ORAL_TABLET | Freq: Once | ORAL | Status: DC | PRN

## 2015-11-09 MED ORDER — ONDANSETRON HCL 4 MG/2ML IJ SOLN
4.0000 mg | Freq: Four times a day (QID) | INTRAMUSCULAR | Status: DC | PRN
  Administered 2015-11-10: 4 mg via INTRAVENOUS
  Filled 2015-11-09: qty 2

## 2015-11-09 MED ORDER — LACTATED RINGERS IV SOLN
INTRAVENOUS | Status: DC | PRN
  Administered 2015-11-09 (×2): via INTRAVENOUS

## 2015-11-09 MED ORDER — ONDANSETRON HCL 4 MG PO TABS: 4.0000 mg | ORAL_TABLET | Freq: Four times a day (QID) | ORAL | Status: DC | PRN

## 2015-11-09 MED ORDER — ALUM & MAG HYDROXIDE-SIMETH 200-200-20 MG/5ML PO SUSP: 30.0000 mL | ORAL | Status: DC | PRN

## 2015-11-09 MED ORDER — FENTANYL CITRATE (PF) 250 MCG/5ML IJ SOLN
INTRAMUSCULAR | Status: AC
  Filled 2015-11-09: qty 5

## 2015-11-09 MED ORDER — CEFAZOLIN SODIUM-DEXTROSE 2-3 GM-% IV SOLR
2.0000 g | INTRAVENOUS | Status: AC
  Administered 2015-11-09: 2 g via INTRAVENOUS

## 2015-11-09 MED ORDER — MENTHOL 3 MG MT LOZG: 1.0000 | LOZENGE | OROMUCOSAL | Status: DC | PRN

## 2015-11-09 MED ORDER — SODIUM CHLORIDE 0.9 % IR SOLN
Status: DC | PRN
  Administered 2015-11-09: 1000 mL

## 2015-11-09 MED ORDER — PROMETHAZINE HCL 25 MG/ML IJ SOLN: 6.2500 mg | INTRAMUSCULAR | Status: DC | PRN

## 2015-11-09 MED ORDER — METHOCARBAMOL 1000 MG/10ML IJ SOLN
500.0000 mg | Freq: Four times a day (QID) | INTRAVENOUS | Status: DC | PRN
  Administered 2015-11-09: 500 mg via INTRAVENOUS
  Filled 2015-11-09 (×2): qty 5

## 2015-11-09 MED ORDER — LEVOTHYROXINE SODIUM 100 MCG PO TABS
100.0000 ug | ORAL_TABLET | Freq: Every day | ORAL | Status: DC
  Administered 2015-11-10: 100 ug via ORAL
  Filled 2015-11-09 (×2): qty 1

## 2015-11-09 SURGICAL SUPPLY — 47 items
APL SKNCLS STERI-STRIP NONHPOA (GAUZE/BANDAGES/DRESSINGS) ×1
BAG SPEC THK2 15X12 ZIP CLS (MISCELLANEOUS)
BAG ZIPLOCK 12X15 (MISCELLANEOUS) IMPLANT
BANDAGE ACE 6X5 VEL STRL LF (GAUZE/BANDAGES/DRESSINGS) ×3 IMPLANT
BANDAGE ELASTIC 6 VELCRO ST LF (GAUZE/BANDAGES/DRESSINGS) ×2 IMPLANT
BENZOIN TINCTURE PRP APPL 2/3 (GAUZE/BANDAGES/DRESSINGS) ×2 IMPLANT
BLADE SAW RECIPROCATING 77.5 (BLADE) ×3 IMPLANT
BLADE SAW SGTL 13.0X1.19X90.0M (BLADE) ×3 IMPLANT
BONE CEMENT PALACOSE (Orthopedic Implant) ×3 IMPLANT
BOWL SMART MIX CTS (DISPOSABLE) ×3 IMPLANT
CAPT KNEE PARTIAL 2 ×2 IMPLANT
CEMENT BONE PALACOSE (Orthopedic Implant) IMPLANT
CLOSURE WOUND 1/2 X4 (GAUZE/BANDAGES/DRESSINGS) ×1
CLOTH BEACON ORANGE TIMEOUT ST (SAFETY) ×3 IMPLANT
CUFF TOURN SGL QUICK 34 (TOURNIQUET CUFF) ×3
CUFF TRNQT CYL 34X4X40X1 (TOURNIQUET CUFF) ×1 IMPLANT
DRAPE U-SHAPE 47X51 STRL (DRAPES) ×3 IMPLANT
DRSG AQUACEL AG ADV 3.5X10 (GAUZE/BANDAGES/DRESSINGS) IMPLANT
DRSG PAD ABDOMINAL 8X10 ST (GAUZE/BANDAGES/DRESSINGS) ×6 IMPLANT
DURAPREP 26ML APPLICATOR (WOUND CARE) ×3 IMPLANT
ELECT REM PT RETURN 9FT ADLT (ELECTROSURGICAL) ×3
ELECTRODE REM PT RTRN 9FT ADLT (ELECTROSURGICAL) ×1 IMPLANT
GAUZE SPONGE 4X4 12PLY STRL (GAUZE/BANDAGES/DRESSINGS) ×3 IMPLANT
GAUZE XEROFORM 1X8 LF (GAUZE/BANDAGES/DRESSINGS) IMPLANT
GLOVE BIO SURGEON STRL SZ7.5 (GLOVE) ×3 IMPLANT
GLOVE BIOGEL PI IND STRL 8 (GLOVE) ×2 IMPLANT
GLOVE BIOGEL PI INDICATOR 8 (GLOVE) ×4
GLOVE ECLIPSE 8.0 STRL XLNG CF (GLOVE) ×3 IMPLANT
GOWN STRL REUS W/TWL XL LVL3 (GOWN DISPOSABLE) ×6 IMPLANT
HANDPIECE INTERPULSE COAX TIP (DISPOSABLE) ×3
LEGGING LITHOTOMY PAIR STRL (DRAPES) ×3 IMPLANT
MANIFOLD NEPTUNE II (INSTRUMENTS) ×3 IMPLANT
PACK BLADE SAW RECIP 70 3 PT (BLADE) ×2 IMPLANT
PACK TOTAL KNEE CUSTOM (KITS) ×3 IMPLANT
PAD ABD 8X10 STRL (GAUZE/BANDAGES/DRESSINGS) ×2 IMPLANT
PADDING CAST COTTON 6X4 STRL (CAST SUPPLIES) ×3 IMPLANT
SET HNDPC FAN SPRY TIP SCT (DISPOSABLE) ×1 IMPLANT
STAPLER VISISTAT 35W (STAPLE) IMPLANT
STRIP CLOSURE SKIN 1/2X4 (GAUZE/BANDAGES/DRESSINGS) ×1 IMPLANT
SUT MNCRL AB 4-0 PS2 18 (SUTURE) IMPLANT
SUT VIC AB 0 CT1 36 (SUTURE) IMPLANT
SUT VIC AB 1 CT1 36 (SUTURE) ×3 IMPLANT
SUT VIC AB 2-0 CT1 27 (SUTURE) ×6
SUT VIC AB 2-0 CT1 TAPERPNT 27 (SUTURE) ×2 IMPLANT
TRAY FOLEY W/METER SILVER 14FR (SET/KITS/TRAYS/PACK) ×1 IMPLANT
TRAY FOLEY W/METER SILVER 16FR (SET/KITS/TRAYS/PACK) ×1 IMPLANT
WRAP KNEE MAXI GEL POST OP (GAUZE/BANDAGES/DRESSINGS) ×3 IMPLANT

## 2015-11-09 NOTE — H&P (Signed)
  No change from last week's H&P 5 days ago.  Surgery was canceled due to physician issues and not patient issues.  She understand fully that we are proceeding with a left partial knee replacement today to address her left knee medial compartment arthritis.  Risks and benefits have been discussed in detail and informed consent is obtained.  Again, no changes from H&P done 5 days ago.

## 2015-11-09 NOTE — Anesthesia Procedure Notes (Signed)
Spinal Patient location during procedure: OR Start time: 11/09/2015 8:40 AM End time: 11/09/2015 8:45 AM Staffing Anesthesiologist: Franne Grip Resident/CRNA: Lupita Raider F Performed by: resident/CRNA  Preanesthetic Checklist Completed: patient identified, site marked, surgical consent, pre-op evaluation, timeout performed, IV checked, risks and benefits discussed and monitors and equipment checked Spinal Block Patient position: sitting Prep: Betadine and site prepped and draped Patient monitoring: heart rate, cardiac monitor, continuous pulse ox and blood pressure Approach: midline Location: L3-4 Injection technique: single-shot Needle Needle type: Sprotte  Needle gauge: 24 G Needle length: 5 cm Assessment Sensory level: T6 Additional Notes Patient positioned sitting. Sterile prep and drape. SAB placed without difficulty. [facility] NEEDLE TRAY Lot # [id number] exp 2017-03-18

## 2015-11-09 NOTE — Anesthesia Postprocedure Evaluation (Signed)
Anesthesia Post Note  Patient: [name]  Procedure(s) Performed: Procedure(s) (LRB): LEFT UNICOMPARTMENTAL KNEE ARTHROPLASTY (Left)  Patient location during evaluation: PACU Anesthesia Type: Spinal Level of consciousness: oriented and awake and alert Pain management: pain level controlled Vital Signs Assessment: post-procedure vital signs reviewed and stable Respiratory status: spontaneous breathing, respiratory function stable and patient connected to nasal cannula oxygen Cardiovascular status: blood pressure returned to baseline and stable Postop Assessment: no headache, no backache and spinal receding Anesthetic complications: no    Last Vitals:  Filed Vitals:   11/09/15 1229 11/09/15 1350  BP: 129/77 142/75  Pulse: 86 95  Temp: 36.9 C 37.3 C  Resp: 16 18    Last Pain:  Filed Vitals:   11/09/15 1356  PainSc: 6                  Angelin Cutrone J

## 2015-11-09 NOTE — Op Note (Signed)
[name], [name] NO.:  [id number]  MEDICAL RECORD NO.:  [id number]  LOCATION:  [facility]                         FACILITY:  [facility]  PHYSICIAN:  Lind Guest. Ninfa Linden, M.D.DATE OF BIRTH:  02/01/1956  DATE OF PROCEDURE:  11/09/2015 DATE OF DISCHARGE:                              OPERATIVE REPORT   PREOPERATIVE DIAGNOSIS:  Left knee medial compartment osteoarthritis.  POSTOPERATIVE DIAGNOSIS:  Left knee medial compartment osteoarthritis.  PROCEDURE:  Left unicompartmental/partial knee arthroplasty.  IMPLANTS:  Biomet Oxford unicompartmental knee with size small femur, size left A tibial tray, 3 mm mobile-bearing polyethylene insert.  SURGEON:  Lind Guest. Ninfa Linden, M.D.  ASSISTING:  Erskine Emery, PA-C  ANESTHESIA:  Spinal.  TOURNIQUET TIME:  Less than hour and a half.  ANTIBIOTICS:  2 g of IV Ancef.  BLOOD LOSS:  Less than 100 mL.  COMPLICATIONS:  None.  INDICATIONS:  [name] is a 59 year old female well known to me.  She has medial compartment arthritis of her left knee.  This has become quite painful.  She has tried and failed all forms of conservative treatment including multiple injections and an arthroscopic intervention.  She still has recurrent effusions in her knee and pain is daily, it is affecting her mobility, her quality of life, and her activities of daily living to the point that she does wish to proceed with a partial knee arthroplasty.  She understands fully the risks and benefits of this surgery including the risk of acute blood anemia, nerve or vessel injury, fracture, infection, dislocation, and implant failure.  She understands her goals are decreased pain, improved mobility, and overall improved quality of life.  PROCEDURE DESCRIPTION:  After informed consent was obtained and her left leg was signed, she was brought to the operating room and placed supine on the operating table.  She was then sat up and spinal anesthesia  was obtained.  She was then placed supine again.  Her right nonoperative leg was placed in a well leg holder.  A nonsterile tourniquet was placed around her upper thigh of her left leg and her left leg was placed in a well leg holder underneath the thigh.  The left leg was then prepped and draped from the thigh down the ankle with DuraPrep and sterile drapes including sterile stockinette.  With the bed raised, time-out was called and she identified as correct patient and correct left knee.  We then used the Esmarch around the leg and tourniquet was inflated to 300 mm of pressure.  We then made a medial incision just medial to patellar tendon and carried this down to the tibial tubercle.  We dissected down the knee joint and carried out a medial parapatellar arthrotomy finding a large joint effusion.  We then found significant cartilage changes on the medial compartment of her knee but intact ACL and PCL, minimal and no cartilage changes on the lateral side and minimal at the patellofemoral joint.  We then first proceeded with cleaning the remnants of the meniscus from her knee.  We then set our extramedullary cutting guide after making our tibial cut, based off the axis of the knee and the tibial spine as well as the  ankle and second ray.  We made this cut without difficulty using a sagittal and reciprocating saw.  We then did our femoral sizing, based off a small spoon and that is how we made our tibial cut as well.  We then put our distal femoral cutting guide and made that distal femoral cut without difficulty, we chose for a size small femur.  We then went back to the tibia and placed a trial insert for the tibia with the trial insert small for the femur and this was A for the tibia.  We trialed 3-4 polyethylene insert and we were pleased how it was in extension, we went to flexion, we then ended up balancing it with a 5 spigot.  We milled for this as well and then, we were pleased  with the implant motion with the size 3 polyethylene insert with a small femur and A left tibial tray.  We put her through range of motion.  We were pleased with the stability.  We then did our finishing cuts on the femur and the tibia and opened up the real components.  We mixed our cement and irrigated the knee with normal saline solution using pulsatile lavage.  We then cemented the real Biomet Oxford tibial tray size A left followed by the real small femur.  We then compressed our cement using a 4-mm trial insert and once cement had hardened, we cleaned cement, debris from the knee, and we placed the real 3-mm mobile bearing polyethylene insert.  We then irrigated the knee again with normal saline solution using pulsatile lavage.  We closed the arthrotomy with interrupted #1 Vicryl suture followed by 0 Vicryl in the deep tissue, 2-0 Vicryl in subcutaneous tissues, 4-0 Monocryl subcuticular stitch and Steri-Strips on the skin.  Well-padded sterile dressing was applied, and she was taken to the recovery room in stable condition. All final counts were correct.  There were no complications noted.  Of note, Erskine Emery, PA-C, assisted during the entire case.  His assistance was crucial for facilitating all aspects of this case.     Lind Guest. Ninfa Linden, M.D.     CYB/MEDQ  D:  11/09/2015  T:  11/09/2015  Job:  [id number]

## 2015-11-09 NOTE — Anesthesia Preprocedure Evaluation (Addendum)
Anesthesia Evaluation  Patient identified by MRN, date of birth, ID band Patient awake    Reviewed: Allergy & Precautions, NPO status , Patient's Chart, lab work & pertinent test results  Airway Mallampati: II  TM Distance: >3 FB Neck ROM: Full    Dental no notable dental hx.    Pulmonary neg pulmonary ROS,    Pulmonary exam normal breath sounds clear to auscultation       Cardiovascular negative cardio ROS Normal cardiovascular exam Rhythm:Regular Rate:Normal     Neuro/Psych negative neurological ROS  negative psych ROS   GI/Hepatic negative GI ROS, Neg liver ROS,   Endo/Other  Hypothyroidism   Renal/GU negative Renal ROS  negative genitourinary   Musculoskeletal  (+) Arthritis ,   Abdominal (+) + obese,   Peds negative pediatric ROS (+)  Hematology negative hematology ROS (+)   Anesthesia Other Findings   Reproductive/Obstetrics negative OB ROS                             Anesthesia Physical Anesthesia Plan  ASA: II  Anesthesia Plan: Spinal   Post-op Pain Management:    Induction: Intravenous  Airway Management Planned: Natural Airway  Additional Equipment:   Intra-op Plan:   Post-operative Plan: Extubation in OR  Informed Consent: I have reviewed the patients History and Physical, chart, labs and discussed the procedure including the risks, benefits and alternatives for the proposed anesthesia with the patient or authorized representative who has indicated his/her understanding and acceptance.   Dental advisory given  Plan Discussed with: CRNA  Anesthesia Plan Comments: (Discussed risks and benefits of and differences between spinal and general. Discussed risks of spinal including headache, backache, failure, bleeding and hematoma, infection, and nerve damage. Patient consents to spinal. Questions answered. Coagulation studies and platelet count acceptable.)        Anesthesia Quick Evaluation

## 2015-11-09 NOTE — Evaluation (Signed)
Physical Therapy Evaluation Patient Details Name: [name] MRN: [id number] DOB: [birth year] Today's Date: 11/09/2015   History of Present Illness  L UKR  Clinical Impression  Pt s/p L UKR presents with decreased L LE strength/ROM and post op pain limiting functional mobility.  Pt should progress well to dc home with family assist and would benefit from follow up [facility].    Follow Up Recommendations Home health PT    Equipment Recommendations  None recommended by PT    Recommendations for Other Services OT consult     Precautions / Restrictions Precautions Precautions: Fall Restrictions Weight Bearing Restrictions: No Other Position/Activity Restrictions: WBAT      Mobility  Bed Mobility Overal bed mobility: Needs Assistance Bed Mobility: Supine to Sit;Sit to Supine     Supine to sit: Min guard Sit to supine: Min guard   General bed mobility comments: cues for sequence and use of R LE to self assist  Transfers Overall transfer level: Needs assistance Equipment used: Rolling walker (2 wheeled) Transfers: Sit to/from Stand Sit to Stand: Min assist         General transfer comment: cues for LE management and use of UEs to self assist  Ambulation/Gait Ambulation/Gait assistance: Min assist Ambulation Distance (Feet): 40 Feet Assistive device: Rolling walker (2 wheeled) Gait Pattern/deviations: Shuffle;Trunk flexed;Decreased step length - right;Decreased step length - left;Step-to pattern;Step-through pattern     General Gait Details: cues for posture, position from RW and initial sequence  Stairs            Wheelchair Mobility    Modified Rankin (Stroke Patients Only)       Balance                                             Pertinent Vitals/Pain Pain Assessment: 0-10 Pain Score: 6  Pain Location: L knee Pain Descriptors / Indicators: Aching;Sore Pain Intervention(s): Limited activity within patient's  tolerance;Monitored during session;Premedicated before session;Ice applied    Home Living Family/patient expects to be discharged to:: Private residence Living Arrangements: Spouse/significant other Available Help at Discharge: Family Type of Home: House Home Access: Stairs to enter Entrance Stairs-Rails: Right Entrance Stairs-Number of Steps: 5 Home Layout: Two level;Able to live on main level with bedroom/bathroom Home Equipment: Toilet riser;Walker - 2 wheels;Shower seat      Prior Function Level of Independence: Independent               Hand Dominance        Extremity/Trunk Assessment   Upper Extremity Assessment: Overall WFL for tasks assessed           Lower Extremity Assessment: LLE deficits/detail   LLE Deficits / Details: Able to perform IND SLR, AAROM ltd to 30 flex 2* pain  Cervical / Trunk Assessment: Normal  Communication   Communication: No difficulties  Cognition Arousal/Alertness: Awake/alert Behavior During Therapy: WFL for tasks assessed/performed Overall Cognitive Status: Within Functional Limits for tasks assessed                      General Comments      Exercises Total Joint Exercises Ankle Circles/Pumps: AROM;Both;15 reps;Supine Quad Sets: AROM;Both;5 reps;Supine Heel Slides: AAROM;5 reps;Supine;Left Straight Leg Raises: AROM;Left;10 reps;Supine      Assessment/Plan    PT Assessment Patient needs continued PT services  PT Diagnosis Difficulty walking  PT Problem List Decreased strength;Decreased range of motion;Decreased activity tolerance;Decreased mobility;Decreased knowledge of use of DME;Pain  PT Treatment Interventions DME instruction;Gait training;Stair training;Functional mobility training;Therapeutic activities;Therapeutic exercise;Patient/family education   PT Goals (Current goals can be found in the Care Plan section) Acute Rehab PT Goals Patient Stated Goal: Regain IND PT Goal Formulation: With  patient Time For Goal Achievement: 11/11/15 Potential to Achieve Goals: Good    Frequency 7X/week   Barriers to discharge        Co-evaluation               End of Session Equipment Utilized During Treatment: Gait belt Activity Tolerance: Patient limited by fatigue;Patient limited by pain Patient left: in bed;with call bell/phone within reach Nurse Communication: Mobility status         Time: [id number] PT Time Calculation (min) (ACUTE ONLY): 26 min   Charges:   PT Evaluation $Initial PT Evaluation Tier I: 1 Procedure PT Treatments $Gait Training: 8-22 mins   PT G Codes:        Eugean Arnott November 21, 2015, 4:14 PM

## 2015-11-09 NOTE — Transfer of Care (Signed)
Immediate Anesthesia Transfer of Care Note  Patient: [name]  Procedure(s) Performed: Procedure(s): LEFT UNICOMPARTMENTAL KNEE ARTHROPLASTY (Left)  Patient Location: PACU  Anesthesia Type:MAC and Spinal  Level of Consciousness: awake, alert  and oriented  Airway & Oxygen Therapy: Patient Spontanous Breathing and Patient connected to nasal cannula oxygen  Post-op Assessment: Report given to RN and Post -op Vital signs reviewed and stable  Post vital signs: Reviewed and stable  Last Vitals:  Filed Vitals:   11/09/15 0635  BP: 133/89  Pulse: 80  Temp: 37 C  Resp: 18    Complications: No apparent anesthesia complications

## 2015-11-09 NOTE — Brief Op Note (Signed)
11/09/2015  10:23 AM  PATIENT:  [name]  59 y.o. female  PRE-OPERATIVE DIAGNOSIS:  left knee medial compartment osteoarthritis  POST-OPERATIVE DIAGNOSIS:  left knee medial compartment osteoarthritis  PROCEDURE:  Procedure(s): LEFT UNICOMPARTMENTAL KNEE ARTHROPLASTY (Left)  SURGEON:  Surgeon(s) and Role:    * Mcarthur Rossetti, MD - Primary  PHYSICIAN ASSISTANT: Benita Stabile, PA-C  ANESTHESIA:   spinal  EBL:  Total I/O In: -  Out: 100 [Blood:100]  BLOOD ADMINISTERED:none  DRAINS: none   LOCAL MEDICATIONS USED:  NONE  SPECIMEN:  No Specimen  DISPOSITION OF SPECIMEN:  N/A  COUNTS:  YES  TOURNIQUET:   Total Tourniquet Time Documented: Thigh (Left) - 77 minutes Total: Thigh (Left) - 77 minutes   DICTATION: .Other Dictation: Dictation Number [id number]  PLAN OF CARE: Admit to inpatient   PATIENT DISPOSITION:  PACU - hemodynamically stable.   Delay start of Pharmacological VTE agent (>24hrs) due to surgical blood loss or risk of bleeding: no

## 2015-11-10 ENCOUNTER — Encounter (HOSPITAL_COMMUNITY): Payer: Self-pay | Admitting: Orthopaedic Surgery

## 2015-11-10 DIAGNOSIS — M179 Osteoarthritis of knee, unspecified: Secondary | ICD-10-CM | POA: Diagnosis not present

## 2015-11-10 MED ORDER — OXYCODONE HCL 5 MG PO TABS: 5.0000 mg | ORAL_TABLET | ORAL | Status: DC | PRN

## 2015-11-10 MED ORDER — ASPIRIN 325 MG PO TBEC: 325.0000 mg | DELAYED_RELEASE_TABLET | Freq: Two times a day (BID) | ORAL | Status: DC

## 2015-11-10 MED ORDER — METHOCARBAMOL 500 MG PO TABS: 500.0000 mg | ORAL_TABLET | Freq: Four times a day (QID) | ORAL | Status: DC | PRN

## 2015-11-10 NOTE — Discharge Summary (Signed)
Patient ID: [name] MRN: [id number] DOB/AGE: 59/17/1957 59 y.o.  Admit date: 11/09/2015 Discharge date: 11/10/2015  Admission Diagnoses:  Principal Problem:   Osteoarthritis of left knee medial compartment Active Problems:   Status post left partial knee replacement   Discharge Diagnoses:  Same  Past Medical History  Diagnosis Date  . Thyroid disease   . Rotator cuff sprain   . Hypothyroidism   . Arthritis   . Golfer's elbow 2012    Surgeries: Procedure(s): LEFT UNICOMPARTMENTAL KNEE ARTHROPLASTY on 11/09/2015   Consultants:    Discharged Condition: Improved  Hospital Course: [name] is an 59 y.o. female who was admitted 11/09/2015 for operative treatment ofOsteoarthritis of left knee. Patient has severe unremitting pain that affects sleep, daily activities, and work/hobbies. After pre-op clearance the patient was taken to the operating room on 11/09/2015 and underwent  Procedure(s): LEFT UNICOMPARTMENTAL KNEE ARTHROPLASTY.    Patient was given perioperative antibiotics: Anti-infectives    Start     Dose/Rate Route Frequency Ordered Stop   11/09/15 1400  ceFAZolin (ANCEF) IVPB 1 g/50 mL premix     1 g 100 mL/hr over 30 Minutes Intravenous Every 6 hours 11/09/15 1142 11/09/15 2040   11/09/15 0712  ceFAZolin (ANCEF) IVPB 2 g/50 mL premix     2 g 100 mL/hr over 30 Minutes Intravenous On call to O.R. 11/09/15 [id number] 11/09/15 0859       Patient was given sequential compression devices, early ambulation, and chemoprophylaxis to prevent DVT.  Patient benefited maximally from hospital stay and there were no complications.    Recent vital signs: Patient Vitals for the past 24 hrs:  BP Temp Temp src Pulse Resp SpO2  11/10/15 0935 118/70 mmHg 97.9 F (36.6 C) Oral 88 16 98 %  11/10/15 0600 121/60 mmHg 97.8 F (36.6 C) Oral 79 15 95 %  11/10/15 0230 (!) 108/57 mmHg 97.9 F (36.6 C) Oral 86 14 95 %  11/09/15 2200 128/77 mmHg 98 F (36.7 C) Oral 84 16 97 %      Recent laboratory studies: No results for input(s): WBC, HGB, HCT, PLT, NA, K, CL, CO2, BUN, CREATININE, GLUCOSE, INR, CALCIUM in the last 72 hours.  Invalid input(s): PT, 2   Discharge Medications:     Medication List    TAKE these medications        aspirin 325 MG EC tablet  Take 1 tablet (325 mg total) by mouth 2 (two) times daily after a meal.     levothyroxine 100 MCG tablet  Commonly known as:  SYNTHROID, LEVOTHROID  Take 100 mcg by mouth daily before breakfast.     methocarbamol 500 MG tablet  Commonly known as:  ROBAXIN  Take 1 tablet (500 mg total) by mouth every 6 (six) hours as needed for muscle spasms.     oxyCODONE 5 MG immediate release tablet  Commonly known as:  Oxy IR/ROXICODONE  Take 1-2 tablets (5-10 mg total) by mouth every 4 (four) hours as needed for breakthrough pain.        Diagnostic Studies: Dg Knee Left Port  11/09/2015  CLINICAL DATA:  Partial knee replacement EXAM: PORTABLE LEFT KNEE - 1-2 VIEW COMPARISON:  MRI 12/28/2014 FINDINGS: Medial compartment arthroplasty. Components grossly well positioned. No radiographic complication evident. Moderate-sized joint effusion. Soft tissue air consistent with the recent surgery. IMPRESSION: Medial compartment arthroplasty. No radiographic complication. Patient does have a moderate joint effusion. Electronically Signed   By: Nelson Chimes M.D.   On:  11/09/2015 11:22    Disposition: 01-Home or Self Care      Discharge Instructions    Call MD / Call 911    Complete by:  As directed   If you experience chest pain or shortness of breath, CALL 911 and be transported to the hospital emergency room.  If you develope a fever above 101 F, pus (white drainage) or increased drainage or redness at the wound, or calf pain, call your surgeon's office.     Constipation Prevention    Complete by:  As directed   Drink plenty of fluids.  Prune juice may be helpful.  You may use a stool softener, such as Colace (over  the counter) 100 mg twice a day.  Use MiraLax (over the counter) for constipation as needed.     Diet - low sodium heart healthy    Complete by:  As directed      Discharge patient    Complete by:  As directed      Increase activity slowly as tolerated    Complete by:  As directed            Follow-up Information    Follow up with Mcarthur Rossetti, MD In 2 weeks.   Specialty:  Orthopedic Surgery   Contact information:   [place] [address] [phone]       Follow up with [facility].   Why:  physical therapy   Contact information:   [address] [place]  [address] [phone]        Signed: Mcarthur Rossetti 11/10/2015, 7:10 PM

## 2015-11-10 NOTE — Progress Notes (Signed)
Physical Therapy Treatment Patient Details Name: [name] MRN: [id number] DOB: June 07, 1956 Today's Date: 11/10/2015    History of Present Illness L UKR    PT Comments    Pt progressing with mobility but continues to c/o dizziness with mobility - BP 144/82  Follow Up Recommendations  Home health PT     Equipment Recommendations  None recommended by PT    Recommendations for Other Services OT consult     Precautions / Restrictions Precautions Precautions: Fall Restrictions Weight Bearing Restrictions: No Other Position/Activity Restrictions: WBAT    Mobility  Bed Mobility Overal bed mobility: Needs Assistance Bed Mobility: Supine to Sit     Supine to sit: Min guard     General bed mobility comments: cues for sequence and use of R LE to self assist  Transfers Overall transfer level: Needs assistance Equipment used: Rolling walker (2 wheeled) Transfers: Sit to/from Stand Sit to Stand: Min guard         General transfer comment: cues for LE management and use of UEs to self assist  Ambulation/Gait Ambulation/Gait assistance: Min assist Ambulation Distance (Feet): 50 Feet (and 20) Assistive device: Rolling walker (2 wheeled) Gait Pattern/deviations: Step-to pattern;Step-through pattern;Decreased step length - right;Decreased step length - left;Shuffle;Trunk flexed Gait velocity: decreased 2* c/o dizziness   General Gait Details: cues for posture, position from RW and initial sequence   Stairs            Wheelchair Mobility    Modified Rankin (Stroke Patients Only)       Balance                                    Cognition Arousal/Alertness: Awake/alert Behavior During Therapy: WFL for tasks assessed/performed Overall Cognitive Status: Within Functional Limits for tasks assessed                      Exercises Total Joint Exercises Ankle Circles/Pumps: AROM;Both;15 reps;Supine Quad Sets: AROM;Both;Supine;10  reps Heel Slides: AAROM;Supine;Left;10 reps Straight Leg Raises: Left;10 reps;Supine;AAROM;AROM    General Comments        Pertinent Vitals/Pain Pain Assessment: 0-10 Pain Score: 6  Pain Location: L knee Pain Descriptors / Indicators: Aching;Sore Pain Intervention(s): Limited activity within patient's tolerance;Monitored during session;Premedicated before session;Ice applied    Home Living                      Prior Function            PT Goals (current goals can now be found in the care plan section) Acute Rehab PT Goals Patient Stated Goal: Regain IND PT Goal Formulation: With patient Time For Goal Achievement: 11/11/15 Potential to Achieve Goals: Good Progress towards PT goals: Progressing toward goals    Frequency  7X/week    PT Plan Current plan remains appropriate    Co-evaluation             End of Session Equipment Utilized During Treatment: Gait belt Activity Tolerance: Patient limited by fatigue;Patient limited by pain;Other (comment) (and dizziness - BP 144/82) Patient left: in chair;with call bell/phone within reach     Time: 1030-1100 PT Time Calculation (min) (ACUTE ONLY): 30 min  Charges:  $Gait Training: 8-22 mins $Therapeutic Exercise: 8-22 mins                    G Codes:  Aleyssa Pike 11/10/2015, 1:15 PM

## 2015-11-10 NOTE — Care Management Note (Signed)
Case Management Note  Patient Details  Name: [name] MRN: [id number] Date of Birth: 1956-02-14  Subjective/Objective:     S/p Left unicompartmental/partial knee arthroplasty               Action/Plan: Discharge planning, spoke with patient at bedside. Have chosen [facility] for [facility]. Contacted [facility] for referral. Has all needed DME at home.  Expected Discharge Date:                  Expected Discharge Plan:  [facility]  In-House Referral:  NA  Discharge planning Services  CM Consult  Post Acute Care Choice:  Home Health Choice offered to:  Patient  DME Arranged:  N/A DME Agency:  NA  HH Arranged:  PT HH Agency:  [facility]  Status of Service:  Completed, signed off  Medicare Important Message Given:    Date Medicare IM Given:    Medicare IM give by:    Date Additional Medicare IM Given:    Additional Medicare Important Message give by:     If discussed at [place] of Stay Meetings, dates discussed:    Additional Comments:  Guadalupe Maple, RN 11/10/2015, 10:37 AM

## 2015-11-10 NOTE — Discharge Instructions (Signed)

## 2015-11-10 NOTE — Progress Notes (Signed)
Physical Therapy Treatment Patient Details Name: [name] MRN: [id number] DOB: [birth year] Today's Date: 11/10/2015    History of Present Illness L UKR    PT Comments    POD # 1 pm session.  Max c/o nasea and feeling "queezy".  Assisted with amb in hallway and practiced stairs.  Returned to room just in time as pt vomited.  Assisted with personal care and then amb to bathroom to void before assisting her back to bed to rest.  Pt felt better after vomiting but also felt really tired.  Applied ICE to knee and reported to RN the above.  Pt ready for D/C to home.  Follow Up Recommendations  Home health PT     Equipment Recommendations  None recommended by PT    Recommendations for Other Services       Precautions / Restrictions Precautions Precautions: Fall Restrictions Weight Bearing Restrictions: No Other Position/Activity Restrictions: WBAT    Mobility  Bed Mobility Overal bed mobility: Needs Assistance Bed Mobility: Sit to Supine       Sit to supine: Min guard   General bed mobility comments: assisted back to bed  Transfers Overall transfer level: Needs assistance Equipment used: Rolling walker (2 wheeled) Transfers: Sit to/from Stand Sit to Stand: Supervision         General transfer comment: one VC on hand placement with stand to sit  Ambulation/Gait Ambulation/Gait assistance: Min guard Ambulation Distance (Feet): 70 Feet Assistive device: Rolling walker (2 wheeled) Gait Pattern/deviations: Step-to pattern;Decreased stance time - left Gait velocity: decreased   General Gait Details: cues for posture, position from RW and initial sequence.  Mild c/o feeling "queezy" and "hot" at end of walk.    Stairs Stairs: Yes Stairs assistance: Min guard Stair Management: One rail Right;Step to pattern;Forwards;With crutches Number of Stairs: 4 General stair comments: <25% VC's on proper sequencing and crutch placement.  Pt familiar with tech as her  spouse had total joint replacement.   Wheelchair Mobility    Modified Rankin (Stroke Patients Only)       Balance                                    Cognition Arousal/Alertness: Awake/alert Behavior During Therapy: WFL for tasks assessed/performed Overall Cognitive Status: Within Functional Limits for tasks assessed                      Exercises      General Comments        Pertinent Vitals/Pain Pain Assessment: 0-10 Pain Score: 6  Pain Location: L knee Pain Descriptors / Indicators: Sore;Tender;Discomfort;Grimacing Pain Intervention(s): Monitored during session;Premedicated before session;Repositioned;Ice applied    Home Living                      Prior Function            PT Goals (current goals can now be found in the care plan section) Progress towards PT goals: Progressing toward goals    Frequency  7X/week    PT Plan Current plan remains appropriate    Co-evaluation             End of Session Equipment Utilized During Treatment: Gait belt Activity Tolerance: Patient tolerated treatment well (after she vomited) Patient left: in bed;with call bell/phone within reach     Time: 1445-1530 PT Time Calculation (min) (ACUTE  ONLY): 45 min  Charges:  $Gait Training: 23-37 mins $Therapeutic Activity: 8-22 mins                    G Codes:      Rica Koyanagi  PTA [facility]  Acute  Rehab Pager      [phone]

## 2015-11-10 NOTE — Progress Notes (Signed)
Subjective: 1 Day Post-Op Procedure(s) (LRB): LEFT UNICOMPARTMENTAL KNEE ARTHROPLASTY (Left) Patient reports pain as moderate.  Some dizziness this am.  Objective: Vital signs in last 24 hours: Temp:  [97.1 F (36.2 C)-99.1 F (37.3 C)] 97.9 F (36.6 C) (12/22 0935) Pulse Rate:  [75-99] 88 (12/22 0935) Resp:  [14-18] 16 (12/22 0935) BP: (105-148)/(57-93) 118/70 mmHg (12/22 0935) SpO2:  [95 %-100 %] 98 % (12/22 0935)  Intake/Output from previous day: 12/21 0701 - 12/22 0700 In: 2340 [P.O.:840; I.V.:1400; IV Piggyback:100] Out: 2150 [Urine:2050; Blood:100] Intake/Output this shift: Total I/O In: 120 [P.O.:120] Out: -   No results for input(s): HGB in the last 72 hours. No results for input(s): WBC, RBC, HCT, PLT in the last 72 hours. No results for input(s): NA, K, CL, CO2, BUN, CREATININE, GLUCOSE, CALCIUM in the last 72 hours. No results for input(s): LABPT, INR in the last 72 hours.  Sensation intact distally Intact pulses distally Dorsiflexion/Plantar flexion intact Incision: dressing C/D/I No cellulitis present Compartment soft  Assessment/Plan: 1 Day Post-Op Procedure(s) (LRB): LEFT UNICOMPARTMENTAL KNEE ARTHROPLASTY (Left) Up with therapy Discharge home with home health this afternoon.  [name] 11/10/2015, 9:40 AM

## 2015-11-11 ENCOUNTER — Ambulatory Visit: Payer: BLUE CROSS/BLUE SHIELD | Admitting: Physical Therapy

## 2015-11-15 ENCOUNTER — Ambulatory Visit: Payer: BLUE CROSS/BLUE SHIELD | Admitting: Physical Therapy

## 2015-11-17 ENCOUNTER — Ambulatory Visit: Payer: BLUE CROSS/BLUE SHIELD | Admitting: Physical Therapy

## 2015-11-22 ENCOUNTER — Ambulatory Visit: Payer: BLUE CROSS/BLUE SHIELD | Admitting: Physical Therapy

## 2015-11-23 ENCOUNTER — Ambulatory Visit: Payer: BLUE CROSS/BLUE SHIELD | Admitting: Physical Therapy

## 2015-11-24 ENCOUNTER — Ambulatory Visit: Payer: BLUE CROSS/BLUE SHIELD | Admitting: Physical Therapy

## 2016-09-02 IMAGING — DX DG KNEE 1-2V PORT*L*
2 series · 2 of 2 positions shown · non-contrast
Comparison: MRI 12/28/2014

CLINICAL DATA: Partial knee replacement

EXAM:
PORTABLE LEFT KNEE - 1-2 VIEW

[knee ap]
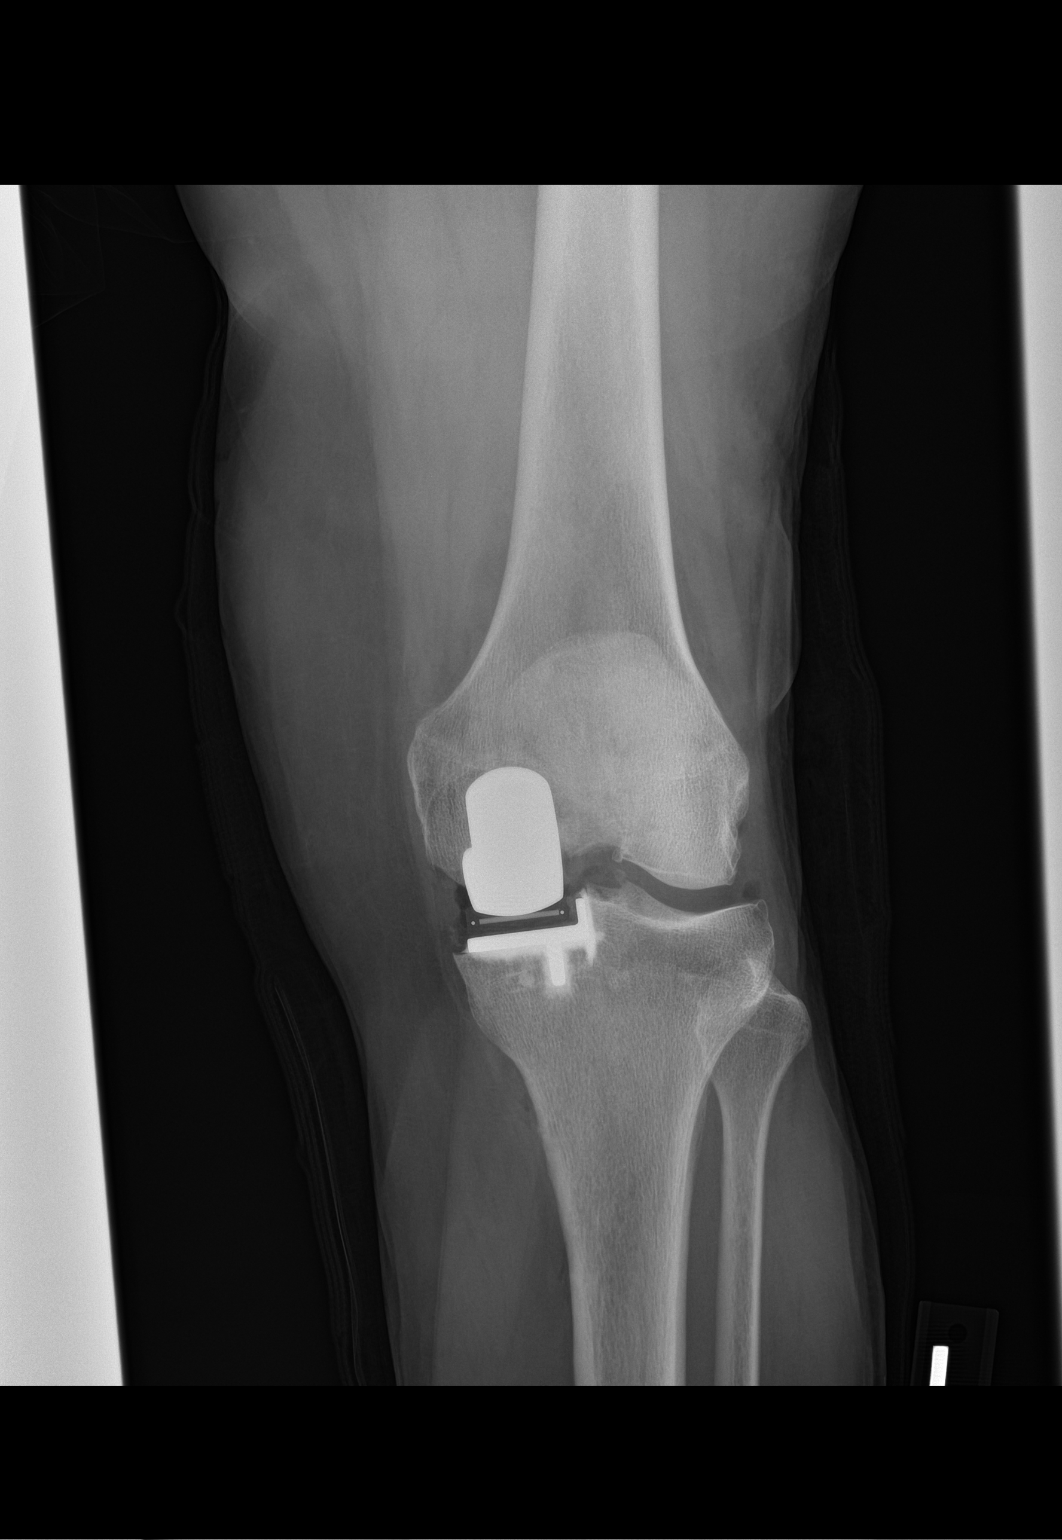

[knee lat]
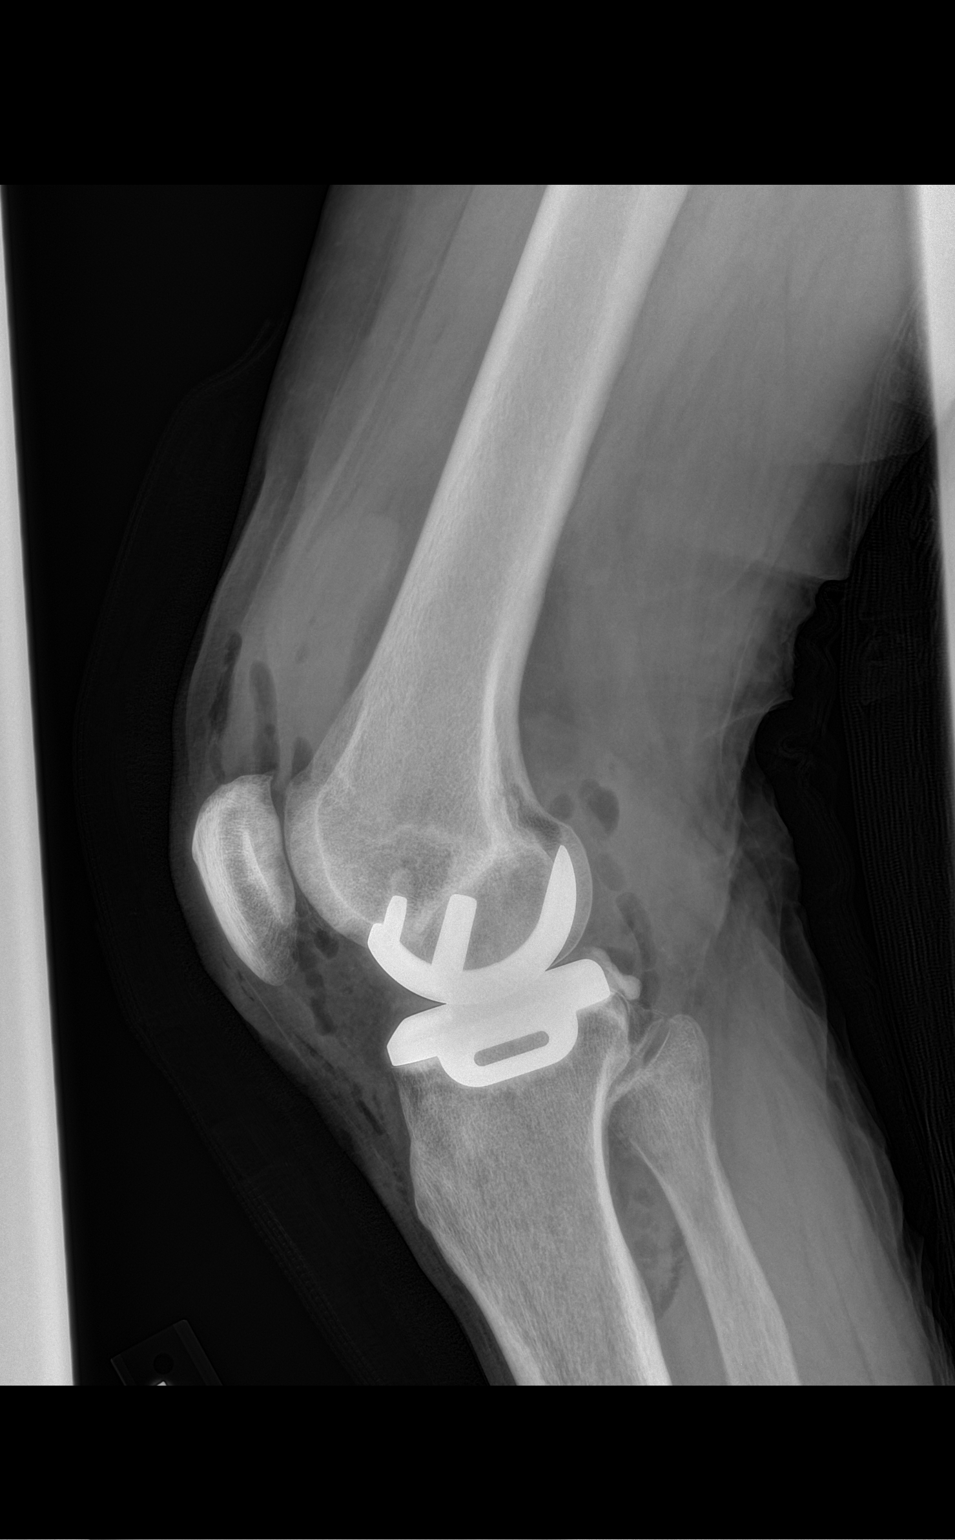

[2 of 2 positions shown; findings below may reference images not displayed]

FINDINGS: Medial compartment arthroplasty. Components grossly well positioned.
No radiographic complication evident. Moderate-sized joint effusion.
Soft tissue air consistent with the recent surgery.
IMPRESSION: Medial compartment arthroplasty. No radiographic complication.
Patient does have a moderate joint effusion.

## 2016-10-24 ENCOUNTER — Ambulatory Visit (INDEPENDENT_AMBULATORY_CARE_PROVIDER_SITE_OTHER): Payer: BLUE CROSS/BLUE SHIELD | Admitting: Orthopaedic Surgery

## 2016-10-24 ENCOUNTER — Ambulatory Visit (INDEPENDENT_AMBULATORY_CARE_PROVIDER_SITE_OTHER): Payer: Self-pay

## 2016-10-24 DIAGNOSIS — Z96652 Presence of left artificial knee joint: Secondary | ICD-10-CM

## 2016-10-24 NOTE — Progress Notes (Signed)
Mrs. [name] is now 1 year out from a partial knee replacement of the medial compartment of her left knee. She has no collection of this knee except for occasional pain around her patella and her patella tendon. She says otherwise her range of motion is full and she has done wonderful. She denies any other problems at all right now. She denies any chest pain, headache, shortness of breath, fever, chills, nausea, vomiting.  Examination of her left knee there is no effusion. Her range of motion is full. She has a little bit of pain of patella tendon and the patella itself is no significant grinding. Her knee is MC stable.  An AP and lateral left knee are obtained and show well-seated implant with no evidence of loosening or, getting features at all. She has some mild patellofemoral arthritic changes.  At this point she'll follow-up as needed.  Thyroid can see her back at any time if there is any issues of this knee at all the risks are starting or swelling she is a serious.

## 2018-05-19 DIAGNOSIS — K852 Alcohol induced acute pancreatitis without necrosis or infection: Secondary | ICD-10-CM

## 2018-05-19 HISTORY — DX: Alcohol induced acute pancreatitis without necrosis or infection: K85.20

## 2018-05-26 ENCOUNTER — Inpatient Hospital Stay (HOSPITAL_COMMUNITY)
Admission: EM | Admit: 2018-05-26 | Discharge: 2018-06-02 | DRG: 440 | Disposition: A | Discharge status: Home / Self Care | Payer: BLUE CROSS/BLUE SHIELD | Attending: Internal Medicine | Admitting: Internal Medicine

## 2018-05-26 ENCOUNTER — Other Ambulatory Visit: Payer: Self-pay

## 2018-05-26 ENCOUNTER — Encounter (HOSPITAL_COMMUNITY): Payer: Self-pay

## 2018-05-26 DIAGNOSIS — K760 Fatty (change of) liver, not elsewhere classified: Secondary | ICD-10-CM | POA: Diagnosis present

## 2018-05-26 DIAGNOSIS — Z96652 Presence of left artificial knee joint: Secondary | ICD-10-CM | POA: Diagnosis present

## 2018-05-26 DIAGNOSIS — Z7982 Long term (current) use of aspirin: Secondary | ICD-10-CM

## 2018-05-26 DIAGNOSIS — E039 Hypothyroidism, unspecified: Secondary | ICD-10-CM | POA: Diagnosis present

## 2018-05-26 DIAGNOSIS — D72829 Elevated white blood cell count, unspecified: Secondary | ICD-10-CM

## 2018-05-26 DIAGNOSIS — F419 Anxiety disorder, unspecified: Secondary | ICD-10-CM | POA: Diagnosis present

## 2018-05-26 DIAGNOSIS — F1020 Alcohol dependence, uncomplicated: Secondary | ICD-10-CM | POA: Diagnosis present

## 2018-05-26 DIAGNOSIS — E890 Postprocedural hypothyroidism: Secondary | ICD-10-CM | POA: Diagnosis present

## 2018-05-26 DIAGNOSIS — D649 Anemia, unspecified: Secondary | ICD-10-CM | POA: Diagnosis present

## 2018-05-26 DIAGNOSIS — Z79899 Other long term (current) drug therapy: Secondary | ICD-10-CM

## 2018-05-26 DIAGNOSIS — E876 Hypokalemia: Secondary | ICD-10-CM | POA: Diagnosis present

## 2018-05-26 DIAGNOSIS — Z79890 Hormone replacement therapy (postmenopausal): Secondary | ICD-10-CM | POA: Diagnosis not present

## 2018-05-26 DIAGNOSIS — K852 Alcohol induced acute pancreatitis without necrosis or infection: Secondary | ICD-10-CM | POA: Diagnosis present

## 2018-05-26 DIAGNOSIS — R030 Elevated blood-pressure reading, without diagnosis of hypertension: Secondary | ICD-10-CM | POA: Diagnosis not present

## 2018-05-26 DIAGNOSIS — K5900 Constipation, unspecified: Secondary | ICD-10-CM | POA: Diagnosis not present

## 2018-05-26 DIAGNOSIS — I10 Essential (primary) hypertension: Secondary | ICD-10-CM | POA: Diagnosis present

## 2018-05-26 LAB — CBC
HCT: 35.6 % — ABNORMAL LOW (ref 36.0–46.0)
Hemoglobin: 12 g/dL (ref 12.0–15.0)
MCH: 31.3 pg (ref 26.0–34.0)
MCHC: 33.7 g/dL (ref 30.0–36.0)
MCV: 92.7 fL (ref 78.0–100.0)
PLATELETS: 234 10*3/uL (ref 150–400)
RBC: 3.84 MIL/uL — ABNORMAL LOW (ref 3.87–5.11)
RDW: 12.1 % (ref 11.5–15.5)
WBC: 10 10*3/uL (ref 4.0–10.5)

## 2018-05-26 LAB — LIPASE, BLOOD: Lipase: 33 U/L (ref 11–51)

## 2018-05-26 LAB — MAGNESIUM: Magnesium: 2 mg/dL (ref 1.7–2.4)

## 2018-05-26 LAB — COMPREHENSIVE METABOLIC PANEL
ALK PHOS: 69 U/L (ref 38–126)
ALT: 16 U/L (ref 0–44)
AST: 17 U/L (ref 15–41)
Albumin: 2.7 g/dL — ABNORMAL LOW (ref 3.5–5.0)
Anion gap: 13 (ref 5–15)
BILIRUBIN TOTAL: 1.2 mg/dL (ref 0.3–1.2)
BUN: 6 mg/dL — AB (ref 8–23)
CALCIUM: 8.5 mg/dL — AB (ref 8.9–10.3)
CO2: 31 mmol/L (ref 22–32)
CREATININE: 0.54 mg/dL (ref 0.44–1.00)
Chloride: 92 mmol/L — ABNORMAL LOW (ref 98–111)
GFR calc Af Amer: >60 mL/min (ref >60)
Glucose, Bld: 127 mg/dL — ABNORMAL HIGH (ref 70–99)
Potassium: 2.9 mmol/L — ABNORMAL LOW (ref 3.5–5.1)
Sodium: 136 mmol/L (ref 135–145)
TOTAL PROTEIN: 6.3 g/dL — AB (ref 6.5–8.1)

## 2018-05-26 MED ORDER — ONDANSETRON HCL 4 MG/2ML IJ SOLN
4.0000 mg | Freq: Once | INTRAMUSCULAR | Status: AC
  Administered 2018-05-26: 4 mg via INTRAVENOUS
  Filled 2018-05-26: qty 2

## 2018-05-26 MED ORDER — THIAMINE HCL 100 MG/ML IJ SOLN
Freq: Once | INTRAVENOUS | Status: AC
  Administered 2018-05-27: 06:00:00 via INTRAVENOUS
  Filled 2018-05-26: qty 1000

## 2018-05-26 MED ORDER — HYDROMORPHONE HCL 1 MG/ML IJ SOLN
1.0000 mg | INTRAMUSCULAR | Status: DC | PRN
  Administered 2018-05-26 – 2018-05-28 (×8): 1 mg via INTRAVENOUS
  Filled 2018-05-26 (×8): qty 1

## 2018-05-26 MED ORDER — HEPARIN SODIUM (PORCINE) 5000 UNIT/ML IJ SOLN
5000.0000 [IU] | Freq: Three times a day (TID) | INTRAMUSCULAR | Status: DC
  Administered 2018-05-27 – 2018-05-30 (×8): 5000 [IU] via SUBCUTANEOUS
  Filled 2018-05-26 (×8): qty 1

## 2018-05-26 MED ORDER — ONDANSETRON HCL 4 MG PO TABS: 4.0000 mg | ORAL_TABLET | Freq: Four times a day (QID) | ORAL | Status: DC | PRN

## 2018-05-26 MED ORDER — LACTATED RINGERS IV SOLN
INTRAVENOUS | Status: DC
  Administered 2018-05-26: 22:00:00 via INTRAVENOUS

## 2018-05-26 MED ORDER — SODIUM CHLORIDE 0.9 % IV BOLUS
1000.0000 mL | Freq: Once | INTRAVENOUS | Status: AC
  Administered 2018-05-26: 1000 mL via INTRAVENOUS

## 2018-05-26 MED ORDER — MORPHINE SULFATE (PF) 4 MG/ML IV SOLN
4.0000 mg | Freq: Once | INTRAVENOUS | Status: AC
  Administered 2018-05-26: 4 mg via INTRAVENOUS
  Filled 2018-05-26: qty 1

## 2018-05-26 MED ORDER — POTASSIUM CHLORIDE 10 MEQ/100ML IV SOLN
10.0000 meq | INTRAVENOUS | Status: AC
  Administered 2018-05-26 (×2): 10 meq via INTRAVENOUS
  Filled 2018-05-26 (×2): qty 100

## 2018-05-26 MED ORDER — ONDANSETRON HCL 4 MG/2ML IJ SOLN
4.0000 mg | Freq: Four times a day (QID) | INTRAMUSCULAR | Status: DC | PRN
Start: 2018-05-26 — End: 2018-06-02
  Administered 2018-05-27 – 2018-06-02 (×11): 4 mg via INTRAVENOUS
  Filled 2018-05-26 (×12): qty 2

## 2018-05-26 NOTE — ED Notes (Signed)
Transporter called at this time.

## 2018-05-26 NOTE — ED Notes (Signed)
Patient is aware we need a urine specimen, unable to go at this time, patient would like to get more fluid.

## 2018-05-26 NOTE — ED Provider Notes (Signed)
[facility] DEPT Provider Note   CSN: [id number] Arrival date & time: 05/26/18  1642     History   Chief Complaint Chief Complaint  Patient presents with  . Abdominal Pain    HPI [name] is a 62 y.o. female.  The history is provided by the patient. No language interpreter was used.  Abdominal Pain       62 year old female presenting for evaluation of abdominal pain.  Patient admits that she has history of alcohol abuse.  She developed upper abdominal pain that started 5 days ago when she was out of town.  She was admitted to the hospital with a diagnosis of pancreatitis.  She was hospitalized for 3 days and receiving IV fluid and bowel rest.  She was discharged home yesterday but report worsening abdominal pain along with nausea and vomiting.  She reports vomiting up greenish emesis.  She endorsed chills.  Her pain is currently 5 out of 10.  She went to see her primary care provider for a follow-up this morning.  A repeat CT scan was obtained and patient was told to come to the hospital to be admitted for IV antibiotic.  She also report no bowel movement for the past 5 days but able to pass flatus.  She denies history of diabetes.  No dysuria, no chest pain or trouble breathing.  Past Medical History:  Diagnosis Date  . Arthritis   . Golfer's elbow 2012  . Hypothyroidism   . Rotator cuff sprain   . Thyroid disease     Patient Active Problem List   Diagnosis Date Noted  . Status post left partial knee replacement 11/09/2015  . Osteoarthritis of left knee medial compartment 11/04/2015    Past Surgical History:  Procedure Laterality Date  . ANKLE SURGERY     screw in left ankle/ 2 surgeries  . [place]    1 time  . MOUTH SURGERY    . PARTIAL KNEE ARTHROPLASTY Left 11/09/2015   Procedure: LEFT UNICOMPARTMENTAL KNEE ARTHROPLASTY;  Surgeon: Mcarthur Rossetti, MD;  Location: [facility];  Service: Orthopedics;  Laterality:  Left;  . THYROIDECTOMY, PARTIAL  1992  . WISDOM TOOTH EXTRACTION  1980     OB History   None      Home Medications    Prior to Admission medications   Medication Sig Start Date End Date Taking? Authorizing Provider  aspirin EC 325 MG EC tablet Take 1 tablet (325 mg total) by mouth 2 (two) times daily after a meal. Patient not taking: Reported on 10/24/2016 11/10/15   Mcarthur Rossetti, MD  levothyroxine (SYNTHROID, LEVOTHROID) 100 MCG tablet Take 100 mcg by mouth daily before breakfast.    [provider]  methocarbamol (ROBAXIN) 500 MG tablet Take 1 tablet (500 mg total) by mouth every 6 (six) hours as needed for muscle spasms. Patient not taking: Reported on 10/24/2016 11/10/15   Mcarthur Rossetti, MD  oxyCODONE (OXY IR/ROXICODONE) 5 MG immediate release tablet Take 1-2 tablets (5-10 mg total) by mouth every 4 (four) hours as needed for breakthrough pain. Patient not taking: Reported on 10/24/2016 11/10/15   Mcarthur Rossetti, MD    Family History Family History  Problem Relation Age of Onset  . Diverticulitis Mother   . Diverticulitis Son     Social History Social History   Tobacco Use  . Smoking status: Never Smoker  . Smokeless tobacco: Never Used  Substance Use Topics  . Alcohol  use: Yes    Alcohol/week: 4.2 oz    Types: 7 Standard drinks or equivalent per week    Comment: socially, weekends  . Drug use: No     Allergies   Patient has no known allergies.   Review of Systems Review of Systems  Gastrointestinal: Positive for abdominal pain.  All other systems reviewed and are negative.    Physical Exam Updated Vital Signs BP 111/76 (BP Location: Right Arm)   Pulse 93   Temp 99 F (37.2 C) (Oral)   Resp 20   Ht 5\' 1"  (1.549 m)   Wt 72.6 kg (160 lb)   LMP 10/27/2010   SpO2 99%   BMI 30.23 kg/m   Physical Exam  Constitutional: She appears well-developed and well-nourished. No distress.  HENT:  Head: Atraumatic.  Eyes:  Conjunctivae are normal.  Neck: Neck supple.  Cardiovascular: Normal rate and regular rhythm.  Pulmonary/Chest: Effort normal and breath sounds normal.  Abdominal: She exhibits distension. Bowel sounds are decreased. There is tenderness in the epigastric area and left upper quadrant. There is no tenderness at McBurney's point and negative Murphy's sign.  Neurological: She is alert.  Skin: No rash noted.  Psychiatric: She has a normal mood and affect.  Nursing note and vitals reviewed.    ED Treatments / Results  Labs (all labs ordered are listed, but only abnormal results are displayed) Labs Reviewed  COMPREHENSIVE METABOLIC PANEL - Abnormal; Notable for the following components:      Result Value   Potassium 2.9 (*)    Chloride 92 (*)    Glucose, Bld 127 (*)    BUN 6 (*)    Calcium 8.5 (*)    Total Protein 6.3 (*)    Albumin 2.7 (*)    All other components within normal limits  CBC - Abnormal; Notable for the following components:   RBC 3.84 (*)    HCT 35.6 (*)    All other components within normal limits  LIPASE, BLOOD  URINALYSIS, ROUTINE W REFLEX MICROSCOPIC    EKG None  Radiology No results found.   Interface, Rad Results In - 05/26/2018 2:06 PM EDT  CLINICAL DATA: 62 year old female with history of abdominal pain. Evaluate for potential obstruction. Pancreatitis.  EXAM: CT ABDOMEN AND PELVIS WITHOUT CONTRAST  TECHNIQUE: Multidetector CT imaging of the abdomen and pelvis was performed following the standard protocol without IV contrast.  COMPARISON: No priors.  FINDINGS: Lower chest: Small right pleural effusion lying dependently with some dependent subsegmental atelectasis in the right lower lobe.  Hepatobiliary: Mild diffuse low attenuation noted throughout the hepatic parenchyma, indicative of hepatic steatosis. Small amount of inflammatory changes adjacent to the gallbladder, favored to be reactive from underlying pancreatitis (see below). No  calcified gallstones. Gallbladder does not appear distended.  Pancreas: Extensive inflammatory changes are noted adjacent to the head of the pancreas, concerning for an acute pancreatitis. Body and tail of the pancreas are grossly unremarkable in appearance on today's noncontrast CT examination.  Spleen: Unremarkable.  Adrenals/Urinary Tract: Unenhanced appearance of the kidneys is unremarkable. 1.8 cm low-attenuation (8 HU) right adrenal nodule is compatible with an adenoma. Mild thickening of the left adrenal gland. No hydroureteronephrosis. Unenhanced appearance of the urinary bladder is unremarkable.  Stomach/Bowel: Unenhanced appearance of the stomach is normal. Extensive inflammatory changes are noted adjacent to the duodenum, likely reactive to underlying pancreatitis, although there may be an associated duodenitis, as the duodenum throughout this region appears thickened. Inflammatory changes extend into the  adjacent mesocolon with some reactive thickening of the hepatic flexure of the colon. Appendix is not confidently identified may be surgically absent.  Vascular/Lymphatic: Aortic atherosclerosis. No lymphadenopathy identified in the abdomen or pelvis.  Reproductive: Uterus is heterogeneous in appearance with multiple lesions, largest of which measures 5.3 cm in the posterior aspect of the uterine body, incompletely characterized on today's noncontrast CT examination, but statistically likely to reflect a fibroid uterus. Ovaries are unremarkable in appearance.  Other: Inflammatory exudate around the head of the pancreas, duodenum, and tracking into the root of the small bowel mesentery as well as the transverse mesocolon focused around the region of the hepatic flexure. In the retroperitoneum inferior to the third portion of the duodenum there is a small focal fluid collection measuring 2.6 x 1.5 cm (axial image 44 of series 2), presumably a small pseudocyst. Trace  amount of perihepatic ascites. Trace amount of ascites also noted in the low anatomic pelvis. No pneumoperitoneum.  Musculoskeletal: There are no aggressive appearing lytic or blastic lesions noted in the visualized portions of the skeleton.  IMPRESSION: 1. Extensive inflammatory changes noted adjacent to the head of the pancreas, around the second, third and fourth portions of the duodenum, with inflammatory exudate tracking into the root of the small bowel mesentery, transverse mesocolon, and caudally into the retroperitoneum with small probable pseudocyst in the retroperitoneum, as detailed above. 2. Trace amount of ascites. 3. Small right pleural effusion with some dependent subsegmental atelectasis in the right lower lobe. 4. Hepatic steatosis. 5. Aortic atherosclerosis.   Electronically Signed By: Vinnie Langton M.D. On: 05/26/2018 14:04  Procedures Procedures (including critical care time)  Medications Ordered in ED Medications  potassium chloride 10 mEq in 100 mL IVPB (10 mEq Intravenous New Bag/Given 05/26/18 1859)  sodium chloride 0.9 % bolus 1,000 mL (1,000 mLs Intravenous New Bag/Given 05/26/18 1801)  ondansetron (ZOFRAN) injection 4 mg (4 mg Intravenous Given 05/26/18 1800)  morphine 4 MG/ML injection 4 mg (4 mg Intravenous Given 05/26/18 1800)     Initial Impression / Assessment and Plan / ED Course  I have reviewed the triage vital signs and the nursing notes.  Pertinent labs & imaging results that were available during my care of the patient were reviewed by me and considered in my medical decision making (see chart for details).     BP (!) 151/79   Pulse 88   Temp 99 F (37.2 C) (Oral)   Resp 15   Ht 5\' 1"  (1.549 m)   Wt 72.6 kg (160 lb)   LMP 10/27/2010   SpO2 97%   BMI 30.23 kg/m    Final Clinical Impressions(s) / ED Diagnoses   Final diagnoses:  Alcohol-induced acute pancreatitis without infection or necrosis    ED Discharge Orders    None       6:01 PM Patient with history of alcohol abuse here with symptoms consistent with pancreatitis.  Was admitted to the hospital recently for pink otitis, discharged yesterday, pain still persist and a CT scan of the abdomen pelvis today demonstrate a inflammatory changes consistent with pancreatitis.  No evidence of SBO.  Plan for admission for further management of her condition.  6:35 PM Patient has normal lipase, lab is remarkable for hypokalemia with potassium of 2.9.  Will provide supplementation, will obtain EKG.  Normal WBC  8:21 PM Appreciate consultation from [facility] Dr. Jonelle Sidle who agrees to see and admit pt for further management.    Domenic Moras, PA-C 05/26/18 2022  Daleen Bo, MD 05/28/18 [phone]

## 2018-05-26 NOTE — H&P (Signed)
History and Physical    [name] JIR:[id number] DOB: 1956-04-21 DOA: 05/26/2018  Referring MD/NP/PA: Dr. Lita Mains  PCP: System, Pcp Not In   Outpatient Specialists: None  Patient coming from: Home  Chief Complaint: Abdominal pain with nausea  HPI: [name] is a 62 y.o. female with medical history significant of alcohol abuse with recent diagnosis of alcoholic pancreatitis while she was at the beach who was treated for about 3 days and discharged.  Patient at the time had lipase of more than 500.  She has not drank any alcohol since then which is about a week ago.  Came back home but the pain has gotten worse than when she left the hospital.  She went to see her PCP and a follow-up CT scan confirmed worsening pancreatitis.  Her lipase is still normal in the 30s however she is very tender.  Nausea with vomiting also.  Patient is also noted to have significant hypokalemia with elevated blood pressure.  It appears she has incompletely resolved pancreatitis which is flaring up.  ED Course: Temperature is 99.3 with blood pressure 158/80 pulse 93 respiration of 23 oxygen sat is 92% on room air.  White count is 10,000 with hemoglobin 12,000 platelets 234.  Sodium 136 with potassium 2.9 chloride 92 CO2 34 BUN 6 and creatinine 0.54.  Magnesium is 2.0.  LFTs for the most part out within normal.  Lipase is 33.  EKG showed normal sinus rhythm with bifascicular block  Review of Systems: As per HPI otherwise 10 point review of systems negative.    Past Medical History:  Diagnosis Date  . Arthritis   . Golfer's elbow 2012  . Hypothyroidism   . Rotator cuff sprain   . Thyroid disease     Past Surgical History:  Procedure Laterality Date  . ANKLE SURGERY     screw in left ankle/ 2 surgeries  . [place]    1 time  . MOUTH SURGERY    . PARTIAL KNEE ARTHROPLASTY Left 11/09/2015   Procedure: LEFT UNICOMPARTMENTAL KNEE ARTHROPLASTY;  Surgeon: Mcarthur Rossetti, MD;   Location: [facility];  Service: Orthopedics;  Laterality: Left;  . THYROIDECTOMY, PARTIAL  1992  . [place]     reports that she has never smoked. She has never used smokeless tobacco. She reports that she drinks about 4.2 oz of alcohol per week. She reports that she does not use drugs.  No Known Allergies  Family History  Problem Relation Age of Onset  . Diverticulitis Mother   . Diverticulitis Son     Prior to Admission medications   Medication Sig Start Date End Date Taking? Authorizing Provider  levothyroxine (SYNTHROID, LEVOTHROID) 100 MCG tablet Take 100 mcg by mouth daily before breakfast.   Yes [provider]  ondansetron (ZOFRAN-ODT) 4 MG disintegrating tablet DIS 1 T ON THE TONGUE Q 6 H PRF NAUSEA 05/23/18  Yes [provider]  oxyCODONE-acetaminophen (PERCOCET/ROXICET) 5-325 MG tablet TK 1 T PO Q 6 HOURS PRN PAIN 05/23/18  Yes [provider]  aspirin EC 325 MG EC tablet Take 1 tablet (325 mg total) by mouth 2 (two) times daily after a meal. Patient not taking: Reported on 10/24/2016 11/10/15   Mcarthur Rossetti, MD  methocarbamol (ROBAXIN) 500 MG tablet Take 1 tablet (500 mg total) by mouth every 6 (six) hours as needed for muscle spasms. Patient not taking: Reported on 10/24/2016 11/10/15   Mcarthur Rossetti, MD  oxyCODONE (OXY IR/ROXICODONE) 5 MG immediate release tablet Take 1-2 tablets (5-10 mg total) by mouth every 4 (four) hours as needed for breakthrough pain. Patient not taking: Reported on 10/24/2016 11/10/15   Mcarthur Rossetti, MD    Physical Exam: Vitals:   05/26/18 1830 05/26/18 1900 05/26/18 1902 05/26/18 1930  BP: (!) 158/80 (!) 147/77 (!) 147/77 (!) 151/79  Pulse: 89 87 90 88  Resp:  (!) 23 19 15   Temp:      TempSrc:      SpO2: 93% 99% 95% 97%  Weight:      Height:          Constitutional: NAD, calm, comfortable Vitals:   05/26/18 1830 05/26/18 1900 05/26/18 1902 05/26/18 1930  BP: (!)  158/80 (!) 147/77 (!) 147/77 (!) 151/79  Pulse: 89 87 90 88  Resp:  (!) 23 19 15   Temp:      TempSrc:      SpO2: 93% 99% 95% 97%  Weight:      Height:       Eyes: PERRL, lids and conjunctivae normal ENMT: Mucous membranes are moist. Posterior pharynx clear of any exudate or lesions.Normal dentition.  Neck: normal, supple, no masses, no thyromegaly Respiratory: clear to auscultation bilaterally, no wheezing, no crackles. Normal respiratory effort. No accessory muscle use.  Cardiovascular: Regular rate and rhythm, no murmurs / rubs / gallops. No extremity edema. 2+ pedal pulses. No carotid bruits.  Abdomen: Diffuse tenderness more in the epigastric region no masses palpated. No hepatosplenomegaly. Bowel sounds positive.  Musculoskeletal: no clubbing / cyanosis. No joint deformity upper and lower extremities. Good ROM, no contractures. Normal muscle tone.  Skin: no rashes, lesions, ulcers. No induration Neurologic: CN 2-12 grossly intact. Sensation intact, DTR normal. Strength 5/5 in all 4.  Psychiatric: Normal judgment and insight. Alert and oriented x 3. Normal mood.   Labs on Admission: I have personally reviewed following labs and imaging studies  CBC: Recent Labs  Lab 05/26/18 1700  WBC 10.0  HGB 12.0  HCT 35.6*  MCV 92.7  PLT 893   Basic Metabolic Panel: Recent Labs  Lab 05/26/18 1700  NA 136  K 2.9*  CL 92*  CO2 31  GLUCOSE 127*  BUN 6*  CREATININE 0.54  CALCIUM 8.5*   GFR: Estimated Creatinine Clearance: 66.4 mL/min (by C-G formula based on SCr of 0.54 mg/dL). Liver Function Tests: Recent Labs  Lab 05/26/18 1700  AST 17  ALT 16  ALKPHOS 69  BILITOT 1.2  PROT 6.3*  ALBUMIN 2.7*   Recent Labs  Lab 05/26/18 1700  LIPASE 33   No results for input(s): AMMONIA in the last 168 hours. Coagulation Profile: No results for input(s): INR, PROTIME in the last 168 hours. Cardiac Enzymes: No results for input(s): CKTOTAL, CKMB, CKMBINDEX, TROPONINI in the last  168 hours. BNP (last 3 results) No results for input(s): PROBNP in the last 8760 hours. HbA1C: No results for input(s): HGBA1C in the last 72 hours. CBG: No results for input(s): GLUCAP in the last 168 hours. Lipid Profile: No results for input(s): CHOL, HDL, LDLCALC, TRIG, CHOLHDL, LDLDIRECT in the last 72 hours. Thyroid Function Tests: No results for input(s): TSH, T4TOTAL, FREET4, T3FREE, THYROIDAB in the last 72 hours. Anemia Panel: No results for input(s): VITAMINB12, FOLATE, FERRITIN, TIBC, IRON, RETICCTPCT in the last 72 hours. Urine analysis: No results found for: COLORURINE, APPEARANCEUR, LABSPEC, PHURINE, GLUCOSEU, HGBUR, BILIRUBINUR, KETONESUR, PROTEINUR, UROBILINOGEN, NITRITE, LEUKOCYTESUR Sepsis Labs: @LABRCNTIP (procalcitonin:4,lacticidven:4) )No results found for  this or any previous visit (from the past 240 hour(s)).   Radiological Exams on Admission: No results found.  EKG: Independently reviewed.  Normal sinus rhythm with right bundle branch block and incomplete left fascicular block  Assessment/Plan Principal Problem:   Alcoholic pancreatitis Active Problems:   Hypokalemia   HTN (hypertension), benign     #1 subacute alcoholic pancreatitis: Patient's pancreatitis was probably not completely resolved and now is flaring off after she ate at home.  We will admit her and repeat bowel rest with IV fluids, IV pain medication and supportive care.  Recheck lipase in the morning.  Control nausea with vomiting.  #2 history of alcoholism: Patient's last alcohol intake was about a week ago.  She is not showing any signs of withdrawal.  We will monitor but no need to initiate CIWA protocol  #3 hypokalemia: Profound hypokalemia probably from nausea with vomiting.  Magnesium level is normal.  Aggressively replete potassium and monitor.  #4 elevated blood pressure: No prior history of hypertension.  Probably related to acute illness.  Monitor blood pressure and if  continuously elevated we will treat with IV beta-blockers.   DVT prophylaxis: Lovenox  Code Status: Full code  Family Communication: No family ability  Disposition Plan: Home  Consults called: None  Admission status: Inpatient  Severity of Illness: The appropriate patient status for this patient is INPATIENT. Inpatient status is judged to be reasonable and necessary in order to provide the required intensity of service to ensure the patient's safety. The patient's presenting symptoms, physical exam findings, and initial radiographic and laboratory data in the context of their chronic comorbidities is felt to place them at high risk for further clinical deterioration. Furthermore, it is not anticipated that the patient will be medically stable for discharge from the hospital within 2 midnights of admission. The following factors support the patient status of inpatient.   " The patient's presenting symptoms include abdominal pain nausea with vomiting. " The worrisome physical exam findings include epigastric tenderness. " The initial radiographic and laboratory data are worrisome because of CT abdomen pelvis showing significant pancreatitis done in the outpatient. " The chronic co-morbidities include alcoholic pancreatitis with abuse.   * I certify that at the point of admission it is my clinical judgment that the patient will require inpatient hospital care spanning beyond 2 midnights from the point of admission due to high intensity of service, high risk for further deterioration and high frequency of surveillance required.Barbette Merino MD [facility] Pager [phone]  If 7PM-7AM, please contact night-coverage [email or web address] Password [email or web address]  05/26/2018, 8:22 PM

## 2018-05-26 NOTE — ED Triage Notes (Signed)
Pt arrives today reporting a recent hospitalization c/o abd pain nausea and "infection in bowel." Saw PCP today and was instructed to come to the ED for IV abx.

## 2018-05-27 ENCOUNTER — Other Ambulatory Visit: Payer: Self-pay

## 2018-05-27 DIAGNOSIS — R030 Elevated blood-pressure reading, without diagnosis of hypertension: Secondary | ICD-10-CM

## 2018-05-27 LAB — HIV ANTIBODY (ROUTINE TESTING W REFLEX): HIV Screen 4th Generation wRfx: NONREACTIVE

## 2018-05-27 LAB — COMPREHENSIVE METABOLIC PANEL
ALT: 14 U/L (ref 0–44)
AST: 16 U/L (ref 15–41)
Albumin: 2.3 g/dL — ABNORMAL LOW (ref 3.5–5.0)
Alkaline Phosphatase: 58 U/L (ref 38–126)
Anion gap: 10 (ref 5–15)
BUN: 8 mg/dL (ref 8–23)
CHLORIDE: 100 mmol/L (ref 98–111)
CO2: 29 mmol/L (ref 22–32)
CREATININE: 0.45 mg/dL (ref 0.44–1.00)
Calcium: 8.4 mg/dL — ABNORMAL LOW (ref 8.9–10.3)
Glucose, Bld: 99 mg/dL (ref 70–99)
POTASSIUM: 3.1 mmol/L — AB (ref 3.5–5.1)
Sodium: 139 mmol/L (ref 135–145)
TOTAL PROTEIN: 5.5 g/dL — AB (ref 6.5–8.1)
Total Bilirubin: 0.8 mg/dL (ref 0.3–1.2)

## 2018-05-27 LAB — URINALYSIS, ROUTINE W REFLEX MICROSCOPIC
Bacteria, UA: NONE SEEN
Bilirubin Urine: NEGATIVE
GLUCOSE, UA: NEGATIVE mg/dL
KETONES UR: 80 mg/dL — AB
Leukocytes, UA: NEGATIVE
Nitrite: NEGATIVE
PH: 6 (ref 5.0–8.0)
Protein, ur: 30 mg/dL — AB
SPECIFIC GRAVITY, URINE: 1.017 (ref 1.005–1.030)

## 2018-05-27 LAB — CBC
HCT: 31.2 % — ABNORMAL LOW (ref 36.0–46.0)
Hemoglobin: 10.4 g/dL — ABNORMAL LOW (ref 12.0–15.0)
MCH: 31.4 pg (ref 26.0–34.0)
MCHC: 33.3 g/dL (ref 30.0–36.0)
MCV: 94.3 fL (ref 78.0–100.0)
PLATELETS: 268 10*3/uL (ref 150–400)
RBC: 3.31 MIL/uL — AB (ref 3.87–5.11)
RDW: 12.5 % (ref 11.5–15.5)
WBC: 9.3 10*3/uL (ref 4.0–10.5)

## 2018-05-27 MED ORDER — POTASSIUM CHLORIDE 2 MEQ/ML IV SOLN
INTRAVENOUS | Status: DC
  Filled 2018-05-27: qty 1000

## 2018-05-27 MED ORDER — SODIUM CHLORIDE 0.9 % IV BOLUS
500.0000 mL | Freq: Once | INTRAVENOUS | Status: AC
  Administered 2018-05-27: 500 mL via INTRAVENOUS

## 2018-05-27 MED ORDER — LEVOTHYROXINE SODIUM 100 MCG IV SOLR
50.0000 ug | Freq: Every day | INTRAVENOUS | Status: DC
  Administered 2018-05-27 – 2018-05-29 (×3): 50 ug via INTRAVENOUS
  Filled 2018-05-27 (×4): qty 5

## 2018-05-27 MED ORDER — POTASSIUM CHLORIDE IN NACL 40-0.9 MEQ/L-% IV SOLN
INTRAVENOUS | Status: DC
  Administered 2018-05-27 – 2018-05-28 (×2): 125 mL/h via INTRAVENOUS
  Administered 2018-05-29 – 2018-05-31 (×3): 100 mL/h via INTRAVENOUS
  Filled 2018-05-27 (×8): qty 1000

## 2018-05-27 MED ORDER — SODIUM CHLORIDE 0.9 % IV SOLN: INTRAVENOUS | Status: DC

## 2018-05-27 NOTE — Progress Notes (Signed)
[facility] PROGRESS NOTE    Progress Note  [name]  MVE:[id number] DOB: [birth year] DOA: 05/26/2018 PCP: System, Pcp Not In     Brief Narrative:   [name] is an 62 y.o. female past medical history significant of alcohol abuse, recently diagnosed with alcoholic pancreatitis while on vacation, as per patient she has not drank alcohol since then, went to her PCP a CT scan was done that show worsening pancreatitis, lipase in the ED it was 30.  She has significant nausea and vomiting with abdominal pain radiating to her back with hypokalemia  Assessment/Plan:   Subacute Alcoholic pancreatitis: Patient relates she has not consumed alcohol since she has been discharge on 05/23/2018 CT Scan on 05/27/2018 showed extensive inflammatory changes in the head of the pancreas and the second third and fourth portion laboratory exudate tracking into the root of the mesentery.  Trace ascites.  Letter is not distended and did not show any stones. Continue IV fluids and narcotics.  Hypokalemia: Change IV fluids to with supplemental potassium.  Recheck a basic metabolic panel in the morning.  Elevated blood pressure without a diagnosis of hypertension: Likely due to pain.  DVT prophylaxis: lovenox Family Communication:none Disposition Plan/Barrier to D/C: home in 3-4 days Code Status:     Code Status Orders  (From admission, onward)        Start     Ordered   05/26/18 2143  Full code  Continuous     05/26/18 2142    Code Status History    Date Active Date Inactive Code Status Order ID Comments User Context   11/09/2015 1142 11/10/2015 2032 Full Code [id number]  Mcarthur Rossetti, MD Inpatient    Advance Directive Documentation     Most Recent Value  Type of Advance Directive  Living will, Healthcare Power of Attorney  Pre-existing out of facility DNR order (yellow form or pink MOST form)  -  "MOST" Form in Place?  -        IV Access:    Peripheral  IV   Procedures and diagnostic studies:   No results found.   Medical Consultants:    None.  Anti-Infectives:   None  Subjective:    [name] continues to have epigastric pain which radiates to the back she relates her nausea and vomiting are improved.  Objective:    Vitals:   05/26/18 2000 05/26/18 2100 05/26/18 2150 05/27/18 0618  BP: (!) 141/72 (!) 144/80 136/75 128/68  Pulse: 88 86 83 88  Resp: 20 (!) 21 18 18   Temp:   99.3 F (37.4 C)   TempSrc:      SpO2: 92% 94% 96% 93%  Weight:      Height:        Intake/Output Summary (Last 24 hours) at 05/27/2018 0829 Last data filed at 05/27/2018 0549 Gross per 24 hour  Intake 0 ml  Output 300 ml  Net -300 ml   Filed Weights   05/26/18 1652  Weight: 72.6 kg (160 lb)    Exam: General exam: In no acute distress. Respiratory system: Good air movement and clear to auscultation. Cardiovascular system: S1 & S2 heard, RRR. No JVD, murmurs, rubs, gallops or clicks.  Gastrointestinal system: Bowel sounds, distended with epigastric tenderness. Central nervous system: Alert and oriented. No focal neurological deficits. Extremities: No pedal edema. Skin: No rashes, lesions or ulcers Psychiatry: Judgement and insight appear normal. Mood & affect appropriate.    Data Reviewed:  Labs: Basic Metabolic Panel: Recent Labs  Lab 05/26/18 1700 05/26/18 1755 05/27/18 0542  NA 136  --  139  K 2.9*  --  3.1*  CL 92*  --  100  CO2 31  --  29  GLUCOSE 127*  --  99  BUN 6*  --  8  CREATININE 0.54  --  0.45  CALCIUM 8.5*  --  8.4*  MG  --  2.0  --    GFR Estimated Creatinine Clearance: 66.4 mL/min (by C-G formula based on SCr of 0.45 mg/dL). Liver Function Tests: Recent Labs  Lab 05/26/18 1700 05/27/18 0542  AST 17 16  ALT 16 14  ALKPHOS 69 58  BILITOT 1.2 0.8  PROT 6.3* 5.5*  ALBUMIN 2.7* 2.3*   Recent Labs  Lab 05/26/18 1700  LIPASE 33   No results for input(s): AMMONIA in the last 168  hours. Coagulation profile No results for input(s): INR, PROTIME in the last 168 hours.  CBC: Recent Labs  Lab 05/26/18 1700 05/27/18 0542  WBC 10.0 9.3  HGB 12.0 10.4*  HCT 35.6* 31.2*  MCV 92.7 94.3  PLT 234 268   Cardiac Enzymes: No results for input(s): CKTOTAL, CKMB, CKMBINDEX, TROPONINI in the last 168 hours. BNP (last 3 results) No results for input(s): PROBNP in the last 8760 hours. CBG: No results for input(s): GLUCAP in the last 168 hours. D-Dimer: No results for input(s): DDIMER in the last 72 hours. Hgb A1c: No results for input(s): HGBA1C in the last 72 hours. Lipid Profile: No results for input(s): CHOL, HDL, LDLCALC, TRIG, CHOLHDL, LDLDIRECT in the last 72 hours. Thyroid function studies: No results for input(s): TSH, T4TOTAL, T3FREE, THYROIDAB in the last 72 hours.  Invalid input(s): FREET3 Anemia work up: No results for input(s): VITAMINB12, FOLATE, FERRITIN, TIBC, IRON, RETICCTPCT in the last 72 hours. Sepsis Labs: Recent Labs  Lab 05/26/18 1700 05/27/18 0542  WBC 10.0 9.3   Microbiology No results found for this or any previous visit (from the past 240 hour(s)).   Medications:   . heparin  5,000 Units Subcutaneous Q8H   Continuous Infusions: . lactated ringers 125 mL/hr at 05/26/18 2202      LOS: 1 day   [facility] Pager [phone]  *Please refer to [email or web address], password [email or web address] to get updated schedule on who will round on this patient, as hospitalists switch teams weekly. If 7PM-7AM, please contact night-coverage at [email or web address], password [email or web address] for any overnight needs.  05/27/2018, 8:29 AM

## 2018-05-27 NOTE — Progress Notes (Signed)
Patient handbook provided for pt, informed her it had rights & responsibilities section in the book.

## 2018-05-28 DIAGNOSIS — E876 Hypokalemia: Secondary | ICD-10-CM

## 2018-05-28 DIAGNOSIS — E039 Hypothyroidism, unspecified: Secondary | ICD-10-CM

## 2018-05-28 LAB — CBC WITH DIFFERENTIAL/PLATELET
BASOS ABS: 0 10*3/uL (ref 0.0–0.1)
Basophils Relative: 0 %
EOS PCT: 0 %
Eosinophils Absolute: 0 10*3/uL (ref 0.0–0.7)
HCT: 31.1 % — ABNORMAL LOW (ref 36.0–46.0)
Hemoglobin: 10.1 g/dL — ABNORMAL LOW (ref 12.0–15.0)
LYMPHS ABS: 0.9 10*3/uL (ref 0.7–4.0)
LYMPHS PCT: 10 %
MCH: 31 pg (ref 26.0–34.0)
MCHC: 32.5 g/dL (ref 30.0–36.0)
MCV: 95.4 fL (ref 78.0–100.0)
MONO ABS: 1.4 10*3/uL — AB (ref 0.1–1.0)
Monocytes Relative: 15 %
Neutro Abs: 7.2 10*3/uL (ref 1.7–7.7)
Neutrophils Relative %: 75 %
PLATELETS: 247 10*3/uL (ref 150–400)
RBC: 3.26 MIL/uL — ABNORMAL LOW (ref 3.87–5.11)
RDW: 12.7 % (ref 11.5–15.5)
WBC: 9.5 10*3/uL (ref 4.0–10.5)

## 2018-05-28 LAB — BASIC METABOLIC PANEL WITH GFR
Anion gap: 11 (ref 5–15)
BUN: 6 mg/dL — ABNORMAL LOW (ref 8–23)
CO2: 28 mmol/L (ref 22–32)
Calcium: 8.4 mg/dL — ABNORMAL LOW (ref 8.9–10.3)
Chloride: 100 mmol/L (ref 98–111)
Creatinine, Ser: 0.51 mg/dL (ref 0.44–1.00)
GFR calc Af Amer: >60 mL/min
GFR calc non Af Amer: >60 mL/min
Glucose, Bld: 88 mg/dL (ref 70–99)
Potassium: 3.5 mmol/L (ref 3.5–5.1)
Sodium: 139 mmol/L (ref 135–145)

## 2018-05-28 MED ORDER — HYDROMORPHONE HCL 1 MG/ML IJ SOLN
1.0000 mg | Freq: Four times a day (QID) | INTRAMUSCULAR | Status: DC | PRN
  Administered 2018-05-28 – 2018-05-29 (×5): 1 mg via INTRAVENOUS
  Filled 2018-05-28 (×5): qty 1

## 2018-05-28 MED ORDER — ALUM & MAG HYDROXIDE-SIMETH 200-200-20 MG/5ML PO SUSP
15.0000 mL | ORAL | Status: DC | PRN
  Administered 2018-05-28: 15 mL via ORAL
  Filled 2018-05-28: qty 30

## 2018-05-28 MED ORDER — POTASSIUM CHLORIDE 10 MEQ/100ML IV SOLN: 10.0000 meq | INTRAVENOUS | Status: DC

## 2018-05-28 NOTE — Progress Notes (Signed)
PROGRESS NOTE  [name]:[id number] DOB: 03-09-1956 DOA: 05/26/2018 PCP: System, Pcp Not In  HPI/Recap of past 79 hours: 62 year old female with past medical history significant for alcohol abuse, recently diagnosed with alcoholic pancreatitis while on vacation presented with abdominal pain, nausea/vomiting.  Patient went to her PCP who did a CT scan that showed worsening pancreatitis.  In the ED, lipase was noted to be 30.  Due to significant nausea/vomiting with abdominal pain radiating to her back patient was admitted for further management.  Today, patient reported feeling better, still with some nausea but denied any vomiting. Denies any worsening abdominal pain, fever/chills, chest pain, diarrhea.  Assessment/Plan: Principal Problem:   Alcoholic pancreatitis Active Problems:   Hypokalemia   HTN (hypertension), benign   Elevated blood-pressure reading without diagnosis of hypertension  Subacute alcoholic pancreatitis Improving Denied any recent alcohol consumption since her discharge on 05/23/2018 CT abdomen/pelvis on 05/26/2018 showed extensive inflammatory changes noted adjacent to the head of the pancreas, around the second, third and fourth portion of the duodenum, with inflammatory exudate tracking into the root of the small bowel mesentery, transverse mesocolon and caudally into the retroperitoneum with small probable pseudocyst in the retroperitoneum.  Trace amount of ascites, hepatic steatosis Continue IV fluids, narcotics Advance to [facility]D Monitor closely  Hypokalemia Resolved Replace as needed  Normocytic anemia We will order iron panel Monitor closely, daily CBC  Hypothyroidism Continue synthroid  Elevated BP Likely due to abdominal pain Monitor closely    Code Status: Full  Family Communication: None at bedside  Disposition Plan: Once significantly improved   Consultants:  None  Procedures:  None  Antimicrobials:  None  DVT  prophylaxis: Heparin   Objective: Vitals:   05/27/18 1402 05/27/18 2024 05/28/18 0535 05/28/18 1347  BP: (!) 156/77 (!) 141/72 134/83 (!) 148/86  Pulse: 91 94 87 84  Resp: (!) 22     Temp: 98.8 F (37.1 C) 99.9 F (37.7 C) 98.9 F (37.2 C) 98.8 F (37.1 C)  TempSrc: Oral Oral Oral Oral  SpO2: 96% 92% 97% 97%  Weight:      Height:        Intake/Output Summary (Last 24 hours) at 05/28/2018 1603 Last data filed at 05/28/2018 0930 Gross per 24 hour  Intake 360 ml  Output -  Net 360 ml   Filed Weights   05/26/18 1652  Weight: 72.6 kg (160 lb)    Exam:   General:  NAD   Cardiovascular: S1, S2 present  Respiratory: CTAB  Abdomen: Soft, mildly tender, mildly distended, BS present  Musculoskeletal: No pedal edema  Skin: Normal   Psychiatry: Normal mood   Data Reviewed: CBC: Recent Labs  Lab 05/26/18 1700 05/27/18 0542 05/28/18 0939  WBC 10.0 9.3 9.5  NEUTROABS  --   --  7.2  HGB 12.0 10.4* 10.1*  HCT 35.6* 31.2* 31.1*  MCV 92.7 94.3 95.4  PLT 234 268 767   Basic Metabolic Panel: Recent Labs  Lab 05/26/18 1700 05/26/18 1755 05/27/18 0542 05/28/18 0939  NA 136  --  139 139  K 2.9*  --  3.1* 3.5  CL 92*  --  100 100  CO2 31  --  29 28  GLUCOSE 127*  --  99 88  BUN 6*  --  8 6*  CREATININE 0.54  --  0.45 0.51  CALCIUM 8.5*  --  8.4* 8.4*  MG  --  2.0  --   --    GFR: Estimated  Creatinine Clearance: 66.4 mL/min (by C-G formula based on SCr of 0.51 mg/dL). Liver Function Tests: Recent Labs  Lab 05/26/18 1700 05/27/18 0542  AST 17 16  ALT 16 14  ALKPHOS 69 58  BILITOT 1.2 0.8  PROT 6.3* 5.5*  ALBUMIN 2.7* 2.3*   Recent Labs  Lab 05/26/18 1700  LIPASE 33   No results for input(s): AMMONIA in the last 168 hours. Coagulation Profile: No results for input(s): INR, PROTIME in the last 168 hours. Cardiac Enzymes: No results for input(s): CKTOTAL, CKMB, CKMBINDEX, TROPONINI in the last 168 hours. BNP (last 3 results) No results for  input(s): PROBNP in the last 8760 hours. HbA1C: No results for input(s): HGBA1C in the last 72 hours. CBG: No results for input(s): GLUCAP in the last 168 hours. Lipid Profile: No results for input(s): CHOL, HDL, LDLCALC, TRIG, CHOLHDL, LDLDIRECT in the last 72 hours. Thyroid Function Tests: No results for input(s): TSH, T4TOTAL, FREET4, T3FREE, THYROIDAB in the last 72 hours. Anemia Panel: No results for input(s): VITAMINB12, FOLATE, FERRITIN, TIBC, IRON, RETICCTPCT in the last 72 hours. Urine analysis:    Component Value Date/Time   COLORURINE YELLOW 05/27/2018 0014   APPEARANCEUR HAZY (A) 05/27/2018 0014   LABSPEC 1.017 05/27/2018 0014   PHURINE 6.0 05/27/2018 0014   GLUCOSEU NEGATIVE 05/27/2018 0014   HGBUR SMALL (A) 05/27/2018 0014   BILIRUBINUR NEGATIVE 05/27/2018 0014   KETONESUR 80 (A) 05/27/2018 0014   PROTEINUR 30 (A) 05/27/2018 0014   NITRITE NEGATIVE 05/27/2018 0014   LEUKOCYTESUR NEGATIVE 05/27/2018 0014   Sepsis Labs: @LABRCNTIP (procalcitonin:4,lacticidven:4)  )No results found for this or any previous visit (from the past 240 hour(s)).    Studies: No results found.  Scheduled Meds: . heparin  5,000 Units Subcutaneous Q8H  . levothyroxine  50 mcg Intravenous Daily    Continuous Infusions: . 0.9 % NaCl with KCl 40 mEq / L 125 mL/hr (05/28/18 0622)     LOS: 2 days     Alma Friendly, MD [facility]   If 7PM-7AM, please contact night-coverage [email or web address] Password [email or web address] 05/28/2018, 4:03 PM

## 2018-05-29 DIAGNOSIS — D649 Anemia, unspecified: Secondary | ICD-10-CM

## 2018-05-29 DIAGNOSIS — K852 Alcohol induced acute pancreatitis without necrosis or infection: Principal | ICD-10-CM

## 2018-05-29 DIAGNOSIS — K5900 Constipation, unspecified: Secondary | ICD-10-CM

## 2018-05-29 LAB — BASIC METABOLIC PANEL
ANION GAP: 9 (ref 5–15)
BUN: 5 mg/dL — ABNORMAL LOW (ref 8–23)
CALCIUM: 8.2 mg/dL — AB (ref 8.9–10.3)
CO2: 29 mmol/L (ref 22–32)
Chloride: 98 mmol/L (ref 98–111)
Creatinine, Ser: 0.45 mg/dL (ref 0.44–1.00)
GLUCOSE: 111 mg/dL — AB (ref 70–99)
POTASSIUM: 3.4 mmol/L — AB (ref 3.5–5.1)
SODIUM: 136 mmol/L (ref 135–145)

## 2018-05-29 LAB — IRON AND TIBC
Iron: 9 ug/dL — ABNORMAL LOW (ref 28–170)
Saturation Ratios: 6 % — ABNORMAL LOW (ref 10.4–31.8)
TIBC: 163 ug/dL — ABNORMAL LOW (ref 250–450)
UIBC: 154 ug/dL

## 2018-05-29 LAB — CBC WITH DIFFERENTIAL/PLATELET
BASOS ABS: 0 10*3/uL (ref 0.0–0.1)
BASOS PCT: 0 %
EOS ABS: 0.1 10*3/uL (ref 0.0–0.7)
EOS PCT: 1 %
HCT: 30.1 % — ABNORMAL LOW (ref 36.0–46.0)
Hemoglobin: 9.8 g/dL — ABNORMAL LOW (ref 12.0–15.0)
LYMPHS PCT: 9 %
Lymphs Abs: 1 10*3/uL (ref 0.7–4.0)
MCH: 30.9 pg (ref 26.0–34.0)
MCHC: 32.6 g/dL (ref 30.0–36.0)
MCV: 95 fL (ref 78.0–100.0)
MONO ABS: 1.2 10*3/uL — AB (ref 0.1–1.0)
Monocytes Relative: 12 %
Neutro Abs: 8.2 10*3/uL — ABNORMAL HIGH (ref 1.7–7.7)
Neutrophils Relative %: 78 %
PLATELETS: 317 10*3/uL (ref 150–400)
RBC: 3.17 MIL/uL — AB (ref 3.87–5.11)
RDW: 12.9 % (ref 11.5–15.5)
WBC: 10.5 10*3/uL (ref 4.0–10.5)

## 2018-05-29 LAB — TRIGLYCERIDES: Triglycerides: 176 mg/dL — ABNORMAL HIGH (ref <150)

## 2018-05-29 LAB — FERRITIN: Ferritin: 343 ng/mL — ABNORMAL HIGH (ref 11–307)

## 2018-05-29 MED ORDER — HYDROMORPHONE HCL 1 MG/ML IJ SOLN
1.0000 mg | INTRAMUSCULAR | Status: DC | PRN
  Administered 2018-05-29 – 2018-05-30 (×6): 1 mg via INTRAVENOUS
  Filled 2018-05-29 (×6): qty 1

## 2018-05-29 MED ORDER — SODIUM CHLORIDE 0.9 % IV SOLN
510.0000 mg | Freq: Once | INTRAVENOUS | Status: AC
  Administered 2018-05-29: 510 mg via INTRAVENOUS
  Filled 2018-05-29: qty 17

## 2018-05-29 NOTE — Progress Notes (Signed)
PROGRESS NOTE  [name] HQP:[id number] DOB: [birth year] DOA: 05/26/2018 PCP: System, Pcp Not In  HPI/Recap of past 63 hours: 62 year old female with past medical history significant for alcohol abuse, recently diagnosed with alcoholic pancreatitis while on vacation presented with abdominal pain, nausea/vomiting.  Patient went to her PCP who did a CT scan that showed worsening pancreatitis.  In the ED, lipase was noted to be 30.  Due to significant nausea/vomiting with abdominal pain radiating to her back patient was admitted for further management.  Today, patient reported feeling better, still requiring narcotics for abdominal pain, although feeling better. Denies any N/V, fever/chills, chest pain, diarrhea.  Assessment/Plan: Principal Problem:   Alcoholic pancreatitis Active Problems:   Hypokalemia   HTN (hypertension), benign   Elevated blood-pressure reading without diagnosis of hypertension  Subacute alcoholic pancreatitis Improving Denied any recent alcohol consumption since her discharge on 05/23/2018 CT abdomen/pelvis on 05/26/2018 showed extensive inflammatory changes noted adjacent to the head of the pancreas, around the second, third and fourth portion of the duodenum, with inflammatory exudate tracking into the root of the small bowel mesentery, transverse mesocolon and caudally into the retroperitoneum with small probable pseudocyst in the retroperitoneum.  Trace amount of ascites, hepatic steatosis GI consulted, appreciate recs Continue IV fluids, narcotics Advance to [facility]D Monitor closely  Hypokalemia Resolved Replace as needed  Normocytic anemia Iron panel showed iron 9, sats 6, ferritin 343 S/P 1 dose of feraheme Monitor closely, daily CBC  Hypothyroidism Continue synthroid  Elevated BP BP now stable Monitor closely    Code Status: Full  Family Communication: Husband at bedside  Disposition Plan: Once significantly  improved   Consultants:  GI  Procedures:  None  Antimicrobials:  None  DVT prophylaxis: Heparin   Objective: Vitals:   05/28/18 2156 05/29/18 0600 05/29/18 1434 05/29/18 2057  BP: (!) 144/74 135/87 139/75 134/85  Pulse: 87 94 87 98  Resp: 17 20 18 20   Temp: 99.1 F (37.3 C) 99.9 F (37.7 C) 99.7 F (37.6 C) 99.4 F (37.4 C)  TempSrc:   Oral Oral  SpO2: 93% 95% 96% 97%  Weight:      Height:        Intake/Output Summary (Last 24 hours) at 05/29/2018 2108 Last data filed at 05/29/2018 1742 Gross per 24 hour  Intake 720 ml  Output -  Net 720 ml   Filed Weights   05/26/18 1652  Weight: 72.6 kg (160 lb)    Exam:   General:  NAD   Cardiovascular: S1, S2 present  Respiratory: CTAB  Abdomen: Soft, mildly tender, mildly distended, BS present  Musculoskeletal: No pedal edema  Skin: Normal   Psychiatry: Normal mood   Data Reviewed: CBC: Recent Labs  Lab 05/26/18 1700 05/27/18 0542 05/28/18 0939 05/29/18 0608  WBC 10.0 9.3 9.5 10.5  NEUTROABS  --   --  7.2 8.2*  HGB 12.0 10.4* 10.1* 9.8*  HCT 35.6* 31.2* 31.1* 30.1*  MCV 92.7 94.3 95.4 95.0  PLT 234 268 247 599   Basic Metabolic Panel: Recent Labs  Lab 05/26/18 1700 05/26/18 1755 05/27/18 0542 05/28/18 0939 05/29/18 0608  NA 136  --  139 139 136  K 2.9*  --  3.1* 3.5 3.4*  CL 92*  --  100 100 98  CO2 31  --  29 28 29   GLUCOSE 127*  --  99 88 111*  BUN 6*  --  8 6* 5*  CREATININE 0.54  --  0.45 0.51 0.45  CALCIUM 8.5*  --  8.4* 8.4* 8.2*  MG  --  2.0  --   --   --    GFR: Estimated Creatinine Clearance: 66.4 mL/min (by C-G formula based on SCr of 0.45 mg/dL). Liver Function Tests: Recent Labs  Lab 05/26/18 1700 05/27/18 0542  AST 17 16  ALT 16 14  ALKPHOS 69 58  BILITOT 1.2 0.8  PROT 6.3* 5.5*  ALBUMIN 2.7* 2.3*   Recent Labs  Lab 05/26/18 1700  LIPASE 33   No results for input(s): AMMONIA in the last 168 hours. Coagulation Profile: No results for input(s): INR,  PROTIME in the last 168 hours. Cardiac Enzymes: No results for input(s): CKTOTAL, CKMB, CKMBINDEX, TROPONINI in the last 168 hours. BNP (last 3 results) No results for input(s): PROBNP in the last 8760 hours. HbA1C: No results for input(s): HGBA1C in the last 72 hours. CBG: No results for input(s): GLUCAP in the last 168 hours. Lipid Profile: Recent Labs    05/29/18 1607  TRIG 176*   Thyroid Function Tests: No results for input(s): TSH, T4TOTAL, FREET4, T3FREE, THYROIDAB in the last 72 hours. Anemia Panel: Recent Labs    05/29/18 0608  FERRITIN 343*  TIBC 163*  IRON 9*   Urine analysis:    Component Value Date/Time   COLORURINE YELLOW 05/27/2018 0014   APPEARANCEUR HAZY (A) 05/27/2018 0014   LABSPEC 1.017 05/27/2018 0014   PHURINE 6.0 05/27/2018 0014   GLUCOSEU NEGATIVE 05/27/2018 0014   HGBUR SMALL (A) 05/27/2018 0014   BILIRUBINUR NEGATIVE 05/27/2018 0014   KETONESUR 80 (A) 05/27/2018 0014   PROTEINUR 30 (A) 05/27/2018 0014   NITRITE NEGATIVE 05/27/2018 0014   LEUKOCYTESUR NEGATIVE 05/27/2018 0014   Sepsis Labs: @LABRCNTIP (procalcitonin:4,lacticidven:4)  )No results found for this or any previous visit (from the past 240 hour(s)).    Studies: No results found.  Scheduled Meds: . heparin  5,000 Units Subcutaneous Q8H  . levothyroxine  50 mcg Intravenous Daily    Continuous Infusions: . 0.9 % NaCl with KCl 40 mEq / L 100 mL/hr (05/29/18 2035)     LOS: 3 days     Alma Friendly, MD [facility]   If 7PM-7AM, please contact night-coverage [email or web address] Password [email or web address] 05/29/2018, 9:08 PM

## 2018-05-29 NOTE — Consult Note (Addendum)
Referring Provider: [facility]  Primary Care Physician:  System, Pcp Not In Primary Gastroenterologist:   Lucio Edward, MD Reason for Consultation:    pancreatitis    ASSESSMENT AND PLAN:    59. 62 yo female with acute pancreatitis, probably ETOH related.  Hospitalized for the same last week at [facility]. Patient says u/s was negative for stones. Her liver chemistries are normal, no duct dilation on 05/26/18 CT scan in Care Everywhere. No suspicious medications.  Not hypercalcemic.   -Patient took hospital records to PCP.  Will ask nurses to call PCP and have ultrasound faxed to the floor -Check triglyceride level.  Patient understands this is probably alcohol related pancreatitis -Overall patient is feeling better and tolerating clear liquids.  2. Constipation. Good response to enema two days ago but no BM since.  - will start MiraLAX.  She will likely struggle with constipation while on narcotics  3. Normocytic anemia. Suspect dilutional. Hgb was in normal range, now at 9.8 after IVF so difficult to know a this point. Doubt iron deficiency   4.  Hx of colon polyps, due for surveillance colonoscopy 2020.   5. Hepatic steatosis. Normal transaminases -She has been intentionally losing weight. That with physical activity are mainstays of treatment. Minimal Etoh consumption also important.    HPI: [name] is a 62 y.o. female with thyroid disease. She was on vacation last week. Went to bed feeling fine on July 3rd but woke up around midnight with terrible mid abdominal pain. She drinks ETOH but had been drinking heavy on vacation. Typically has one ETOH beverage on weekdays but on vacation she added several drinks on the weekend. When pain started patient initially thought she had a bowel movement but it didn't bring relief. Tried pepto but didn't help. She had an episode of vomiting. Went to [facility], admitted for a few day for pancreatitis.  Patient recalls her like  is being 555. Treated with bowel rest, IVF. At discharge she was prescribed pain meds and Zofran and advanced to soft foods. At vacation home she stayed nauseated and in pain. Developed bloating and constipation. Drove back to [place] on Sunday.   Saw PCP Monday 05/26/09 with ongoing abdominal pain. WBC was 11.7, hgb 11.7 / crit 34%. Lipase 38.  Non-contrast CT scan 05/26/18 remarkable for extensive inflammation around pancreatic head and duodenum with extension into mesocolon with reactive thickening of hepatic flexure.  PCP advised patient to go to ED, she was admitted here evening of 05/26/18.  For constipation she got an enema two days ago and had a large BM. Bloating improved. She is still having abdominal pain with radiation into her back but overall feels better. This is first episode of pancreatitis.  She has no family history of pancreatic diseases or pancreatic cancer.  Patient has intentionally lost 13 pounds recently.   Past Medical History:  Diagnosis Date  . Arthritis   . Golfer's elbow 2012  . Hypothyroidism   . Rotator cuff sprain   . Thyroid disease     Past Surgical History:  Procedure Laterality Date  . ANKLE SURGERY     screw in left ankle/ 2 surgeries  . [place]    1 time  . MOUTH SURGERY    . PARTIAL KNEE ARTHROPLASTY Left 11/09/2015   Procedure: LEFT UNICOMPARTMENTAL KNEE ARTHROPLASTY;  Surgeon: Mcarthur Rossetti, MD;  Location: [facility]S;  Service: Orthopedics;  Laterality: Left;  . THYROIDECTOMY, PARTIAL  1992  .  [place]    Prior to Admission medications   Medication Sig Start Date End Date Taking? Authorizing Provider  levothyroxine (SYNTHROID, LEVOTHROID) 100 MCG tablet Take 100 mcg by mouth daily before breakfast.   Yes [provider]  ondansetron (ZOFRAN-ODT) 4 MG disintegrating tablet DIS 1 T ON THE TONGUE Q 6 H PRF NAUSEA 05/23/18  Yes [provider]  oxyCODONE-acetaminophen (PERCOCET/ROXICET) 5-325  MG tablet TK 1 T PO Q 6 HOURS PRN PAIN 05/23/18  Yes [provider]  aspirin EC 325 MG EC tablet Take 1 tablet (325 mg total) by mouth 2 (two) times daily after a meal. Patient not taking: Reported on 10/24/2016 11/10/15   Mcarthur Rossetti, MD  methocarbamol (ROBAXIN) 500 MG tablet Take 1 tablet (500 mg total) by mouth every 6 (six) hours as needed for muscle spasms. Patient not taking: Reported on 10/24/2016 11/10/15   Mcarthur Rossetti, MD  oxyCODONE (OXY IR/ROXICODONE) 5 MG immediate release tablet Take 1-2 tablets (5-10 mg total) by mouth every 4 (four) hours as needed for breakthrough pain. Patient not taking: Reported on 10/24/2016 11/10/15   Mcarthur Rossetti, MD    Current Facility-Administered Medications  Medication Dose Route Frequency Provider Last Rate Last Dose  . 0.9 % NaCl with KCl 40 mEq / L  infusion   Intravenous Continuous Charlynne Cousins, MD 125 mL/hr at 05/28/18 0622 125 mL/hr at 05/28/18 0622  . alum & mag hydroxide-simeth (MAALOX/MYLANTA) 200-200-20 MG/5ML suspension 15 mL  15 mL Oral Q4H PRN Charlynne Cousins, MD   15 mL at 05/28/18 0541  . heparin injection 5,000 Units  5,000 Units Subcutaneous Q8H Elwyn Reach, MD   5,000 Units at 05/29/18 0640  . HYDROmorphone (DILAUDID) injection 1 mg  1 mg Intravenous Q6H PRN Alma Friendly, MD   1 mg at 05/29/18 1209  . levothyroxine (SYNTHROID, LEVOTHROID) injection 50 mcg  50 mcg Intravenous Daily Charlynne Cousins, MD   50 mcg at 05/29/18 1050  . ondansetron (ZOFRAN) tablet 4 mg  4 mg Oral Q6H PRN Elwyn Reach, MD       Or  . ondansetron (ZOFRAN) injection 4 mg  4 mg Intravenous Q6H PRN Elwyn Reach, MD   4 mg at 05/29/18 0640    Allergies as of 05/26/2018  . (No Known Allergies)    Family History  Problem Relation Age of Onset  . Diverticulitis Mother   . Diverticulitis Son     Social History   Socioeconomic History  . Marital status: Married    Spouse name:  Not on file  . Number of children: Not on file  . Years of education: Not on file  . Highest education level: Not on file  Occupational History  . Not on file  Social Needs  . Financial resource strain: Not on file  . Food insecurity:    Worry: Not on file    Inability: Not on file  . Transportation needs:    Medical: Not on file    Non-medical: Not on file  Tobacco Use  . Smoking status: Never Smoker  . Smokeless tobacco: Never Used  Substance and Sexual Activity  . Alcohol use: Yes    Alcohol/week: 4.2 oz    Types: 7 Standard drinks or equivalent per week    Comment: socially, weekends  . Drug use: No  . Sexual activity: Not on file  Lifestyle  . Physical activity:    Days per  week: Not on file    Minutes per session: Not on file  . Stress: Not on file  Relationships  . Social connections:    Talks on phone: Not on file    Gets together: Not on file    Attends religious service: Not on file    Active member of club or organization: Not on file    Attends meetings of clubs or organizations: Not on file    Relationship status: Not on file  . Intimate partner violence:    Fear of current or ex partner: Not on file    Emotionally abused: Not on file    Physically abused: Not on file    Forced sexual activity: Not on file  Other Topics Concern  . Not on file  Social History Narrative  . Not on file    Review of Systems: All systems reviewed and negative except where noted in HPI.  Physical Exam: Vital signs in last 24 hours: Temp:  [99.1 F (37.3 C)-99.9 F (37.7 C)] 99.7 F (37.6 C) (07/11 1434) Pulse Rate:  [87-94] 87 (07/11 1434) Resp:  [17-20] 18 (07/11 1434) BP: (135-144)/(74-87) 139/75 (07/11 1434) SpO2:  [93 %-96 %] 96 % (07/11 1434) Last BM Date: 05/27/18 General:   Alert, white female inNAD Psych:  Pleasant, cooperative. Normal mood and affect. Eyes:  Pupils equal, sclera clear, no icterus.   Conjunctiva pink. Ears:  Normal auditory  acuity. Nose:  No deformity, discharge,  or lesions. Neck:  Supple; no masses Lungs:  Clear throughout to auscultation.  Heart:  Regular rate and rhythm; no murmurs, no edema Abdomen:  Soft, protuberant,  Mild diffuse tenderness, BS active   Rectal:  Deferred  Msk:  Symmetrical without gross deformities. . Neurologic:  Alert and  oriented x4;  grossly normal neurologically. Skin:  Intact without significant lesions or rashes..   Intake/Output from previous day: 07/10 0701 - 07/11 0700 In: 600 [P.O.:600] Out: -  Intake/Output this shift: Total I/O In: 240 [P.O.:240] Out: -   Lab Results: Recent Labs    05/27/18 0542 05/28/18 0939 05/29/18 0608  WBC 9.3 9.5 10.5  HGB 10.4* 10.1* 9.8*  HCT 31.2* 31.1* 30.1*  PLT 268 247 317   BMET Recent Labs    05/27/18 0542 05/28/18 0939 05/29/18 0608  NA 139 139 136  K 3.1* 3.5 3.4*  CL 100 100 98  CO2 29 28 29   GLUCOSE 99 88 111*  BUN 8 6* 5*  CREATININE 0.45 0.51 0.45  CALCIUM 8.4* 8.4* 8.2*   LFT Recent Labs    05/27/18 0542  PROT 5.5*  ALBUMIN 2.3*  AST 16  ALT 14  ALKPHOS 70  BILITOT 0.8    Tye Savoy, NP-C @  05/29/2018, 2:39 PM   Attending physician's note   I have taken a history, examined the patient and reviewed the chart. I agree with the Advanced Practitioner's note, impression and recommendations. 21 yr F with subacute pancreatitis, secondary to ETOH and NASH No evidence of gallstones on CT abd& pelvis done [facility] and patient had ultrasound at hospital when she was hospitalized, report not available but was negative for gallstones per patient. LFT normal range with no evidence of biliary obstruction. Triglycerides only slightly elevated Continue IV fluids and clear liquids as tolerated Supportive care and pain management Discussed alcohol cessation Follow up with GI office (Dr Fuller Plan) after discharge in 6-8 weeks Available if have any further questions or concerns   K. Denzil Magnuson ,  MD [phone]

## 2018-05-30 DIAGNOSIS — I10 Essential (primary) hypertension: Secondary | ICD-10-CM

## 2018-05-30 LAB — CBC WITH DIFFERENTIAL/PLATELET
BASOS ABS: 0 10*3/uL (ref 0.0–0.1)
Basophils Relative: 0 %
EOS PCT: 1 %
Eosinophils Absolute: 0.1 10*3/uL (ref 0.0–0.7)
HEMATOCRIT: 30.3 % — AB (ref 36.0–46.0)
HEMOGLOBIN: 9.9 g/dL — AB (ref 12.0–15.0)
LYMPHS ABS: 1.1 10*3/uL (ref 0.7–4.0)
LYMPHS PCT: 9 %
MCH: 30.7 pg (ref 26.0–34.0)
MCHC: 32.7 g/dL (ref 30.0–36.0)
MCV: 94.1 fL (ref 78.0–100.0)
Monocytes Absolute: 1.3 10*3/uL — ABNORMAL HIGH (ref 0.1–1.0)
Monocytes Relative: 10 %
NEUTROS ABS: 10.6 10*3/uL — AB (ref 1.7–7.7)
Neutrophils Relative %: 80 %
PLATELETS: 278 10*3/uL (ref 150–400)
RBC: 3.22 MIL/uL — AB (ref 3.87–5.11)
RDW: 12.6 % (ref 11.5–15.5)
WBC: 13.1 10*3/uL — AB (ref 4.0–10.5)

## 2018-05-30 LAB — BASIC METABOLIC PANEL
ANION GAP: 9 (ref 5–15)
CHLORIDE: 94 mmol/L — AB (ref 98–111)
CO2: 31 mmol/L (ref 22–32)
Calcium: 8.3 mg/dL — ABNORMAL LOW (ref 8.9–10.3)
Creatinine, Ser: 0.44 mg/dL (ref 0.44–1.00)
GFR calc Af Amer: >60 mL/min (ref >60)
GLUCOSE: 112 mg/dL — AB (ref 70–99)
POTASSIUM: 3.5 mmol/L (ref 3.5–5.1)
Sodium: 134 mmol/L — ABNORMAL LOW (ref 135–145)

## 2018-05-30 MED ORDER — HYDROMORPHONE HCL 1 MG/ML IJ SOLN
1.0000 mg | Freq: Four times a day (QID) | INTRAMUSCULAR | Status: DC | PRN
  Administered 2018-05-30 – 2018-06-02 (×12): 1 mg via INTRAVENOUS
  Filled 2018-05-30 (×12): qty 1

## 2018-05-30 MED ORDER — SENNOSIDES-DOCUSATE SODIUM 8.6-50 MG PO TABS
1.0000 | ORAL_TABLET | Freq: Two times a day (BID) | ORAL | Status: DC
  Administered 2018-05-30 – 2018-06-02 (×7): 1 via ORAL
  Filled 2018-05-30 (×7): qty 1

## 2018-05-30 MED ORDER — LEVOTHYROXINE SODIUM 100 MCG PO TABS
100.0000 ug | ORAL_TABLET | Freq: Every day | ORAL | Status: DC
  Administered 2018-05-30 – 2018-06-02 (×4): 100 ug via ORAL
  Filled 2018-05-30 (×4): qty 1

## 2018-05-30 MED ORDER — ENOXAPARIN SODIUM 40 MG/0.4ML ~~LOC~~ SOLN
40.0000 mg | SUBCUTANEOUS | Status: DC
  Administered 2018-05-30 – 2018-06-01 (×3): 40 mg via SUBCUTANEOUS
  Filled 2018-05-30 (×3): qty 0.4

## 2018-05-30 NOTE — Progress Notes (Addendum)
PROGRESS NOTE  [name] LFY:[id number] DOB: 08-Apr-1956 DOA: 05/26/2018 PCP: System, Pcp Not In  HPI/Recap of past 55 hours: 62 year old female with past medical history significant for alcohol abuse, recently diagnosed with alcoholic pancreatitis while on vacation presented with abdominal pain, nausea/vomiting.  Patient went to her PCP who did a CT scan that showed worsening pancreatitis.  In the ED, lipase was noted to be 30.  Due to significant nausea/vomiting with abdominal pain radiating to her back patient was admitted for further management.  Today, patient reports feeling better, still with abdominal pain, requiring narcotics. Anxious about discharge stating "I was discharged too soon".Denies any N/V, fever/chills, chest pain, diarrhea, dysuria.  Assessment/Plan: Principal Problem:   Alcoholic pancreatitis Active Problems:   Hypokalemia   HTN (hypertension), benign   Elevated blood-pressure reading without diagnosis of hypertension  Subacute alcoholic pancreatitis Improving Denied any recent alcohol consumption since her discharge on 05/23/2018 CT abdomen/pelvis on 05/26/2018 showed extensive inflammatory changes noted adjacent to the head of the pancreas, around the second, third and fourth portion of the duodenum, with inflammatory exudate tracking into the root of the small bowel mesentery, transverse mesocolon and caudally into the retroperitoneum with small probable pseudocyst in the retroperitoneum.  Trace amount of ascites, hepatic steatosis GI consulted, no further recs Continue IV fluids, narcotics Continue CLD Monitor closely  Leukocytosis Unclear etiology, no fever No signs of infection Monitor closely, daily CBC  Hypokalemia Resolved Replace as needed  Normocytic anemia Iron panel showed iron 9, sats 6, ferritin 343 S/P 1 dose of feraheme Monitor closely, daily CBC  Hypothyroidism Continue synthroid  Elevated BP BP now stable Monitor  closely    Code Status: Full  Family Communication: None at bedside  Disposition Plan: Once significantly improved   Consultants:  GI  Procedures:  None  Antimicrobials:  None  DVT prophylaxis: Lovenox   Objective: Vitals:   05/29/18 1434 05/29/18 2057 05/30/18 0435 05/30/18 1405  BP: 139/75 134/85 129/79 (!) 142/77  Pulse: 87 98 90 88  Resp: 18 20 (!) 21   Temp: 99.7 F (37.6 C) 99.4 F (37.4 C) 99.3 F (37.4 C) 98.3 F (36.8 C)  TempSrc: Oral Oral Oral Oral  SpO2: 96% 97% 94% 92%  Weight:      Height:        Intake/Output Summary (Last 24 hours) at 05/30/2018 1523 Last data filed at 05/30/2018 1400 Gross per 24 hour  Intake 3344.67 ml  Output -  Net 3344.67 ml   Filed Weights   05/26/18 1652  Weight: 72.6 kg (160 lb)    Exam:   General:  NAD   Cardiovascular: S1, S2 present  Respiratory: CTAB  Abdomen: Soft, mildly tender, mildly distended, BS present  Musculoskeletal: No pedal edema  Skin: Normal   Psychiatry: Normal mood   Data Reviewed: CBC: Recent Labs  Lab 05/26/18 1700 05/27/18 0542 05/28/18 0939 05/29/18 0608 05/30/18 0552  WBC 10.0 9.3 9.5 10.5 13.1*  NEUTROABS  --   --  7.2 8.2* 10.6*  HGB 12.0 10.4* 10.1* 9.8* 9.9*  HCT 35.6* 31.2* 31.1* 30.1* 30.3*  MCV 92.7 94.3 95.4 95.0 94.1  PLT 234 268 247 317 852   Basic Metabolic Panel: Recent Labs  Lab 05/26/18 1700 05/26/18 1755 05/27/18 0542 05/28/18 0939 05/29/18 0608 05/30/18 0552  NA 136  --  139 139 136 134*  K 2.9*  --  3.1* 3.5 3.4* 3.5  CL 92*  --  100 100 98 94*  CO2  31  --  29 28 29 31   GLUCOSE 127*  --  99 88 111* 112*  BUN 6*  --  8 6* 5* <5*  CREATININE 0.54  --  0.45 0.51 0.45 0.44  CALCIUM 8.5*  --  8.4* 8.4* 8.2* 8.3*  MG  --  2.0  --   --   --   --    GFR: Estimated Creatinine Clearance: 66.4 mL/min (by C-G formula based on SCr of 0.44 mg/dL). Liver Function Tests: Recent Labs  Lab 05/26/18 1700 05/27/18 0542  AST 17 16  ALT 16 14   ALKPHOS 69 58  BILITOT 1.2 0.8  PROT 6.3* 5.5*  ALBUMIN 2.7* 2.3*   Recent Labs  Lab 05/26/18 1700  LIPASE 33   No results for input(s): AMMONIA in the last 168 hours. Coagulation Profile: No results for input(s): INR, PROTIME in the last 168 hours. Cardiac Enzymes: No results for input(s): CKTOTAL, CKMB, CKMBINDEX, TROPONINI in the last 168 hours. BNP (last 3 results) No results for input(s): PROBNP in the last 8760 hours. HbA1C: No results for input(s): HGBA1C in the last 72 hours. CBG: No results for input(s): GLUCAP in the last 168 hours. Lipid Profile: Recent Labs    05/29/18 1607  TRIG 176*   Thyroid Function Tests: No results for input(s): TSH, T4TOTAL, FREET4, T3FREE, THYROIDAB in the last 72 hours. Anemia Panel: Recent Labs    05/29/18 0608  FERRITIN 343*  TIBC 163*  IRON 9*   Urine analysis:    Component Value Date/Time   COLORURINE YELLOW 05/27/2018 0014   APPEARANCEUR HAZY (A) 05/27/2018 0014   LABSPEC 1.017 05/27/2018 0014   PHURINE 6.0 05/27/2018 0014   GLUCOSEU NEGATIVE 05/27/2018 0014   HGBUR SMALL (A) 05/27/2018 0014   BILIRUBINUR NEGATIVE 05/27/2018 0014   KETONESUR 80 (A) 05/27/2018 0014   PROTEINUR 30 (A) 05/27/2018 0014   NITRITE NEGATIVE 05/27/2018 0014   LEUKOCYTESUR NEGATIVE 05/27/2018 0014   Sepsis Labs: @LABRCNTIP (procalcitonin:4,lacticidven:4)  )No results found for this or any previous visit (from the past 240 hour(s)).    Studies: No results found.  Scheduled Meds: . enoxaparin (LOVENOX) injection  40 mg Subcutaneous Q24H  . levothyroxine  100 mcg Oral QAC breakfast  . senna-docusate  1 tablet Oral BID    Continuous Infusions: . 0.9 % NaCl with KCl 40 mEq / L 100 mL/hr (05/30/18 0430)     LOS: 4 days     Alma Friendly, MD [facility]   If 7PM-7AM, please contact night-coverage [email or web address] Password [facility] 05/30/2018, 3:23 PM

## 2018-05-30 NOTE — Progress Notes (Signed)
Got report from [employer] at 1215. Will continue to monitor patient.

## 2018-05-31 LAB — CBC WITH DIFFERENTIAL/PLATELET
BASOS PCT: 0 %
Basophils Absolute: 0 10*3/uL (ref 0.0–0.1)
EOS PCT: 1 %
Eosinophils Absolute: 0.1 10*3/uL (ref 0.0–0.7)
HEMATOCRIT: 35.7 % — AB (ref 36.0–46.0)
Hemoglobin: 11.3 g/dL — ABNORMAL LOW (ref 12.0–15.0)
LYMPHS ABS: 1.5 10*3/uL (ref 0.7–4.0)
Lymphocytes Relative: 11 %
MCH: 30.1 pg (ref 26.0–34.0)
MCHC: 31.7 g/dL (ref 30.0–36.0)
MCV: 95.2 fL (ref 78.0–100.0)
MONO ABS: 1.2 10*3/uL — AB (ref 0.1–1.0)
Monocytes Relative: 9 %
NEUTROS ABS: 11 10*3/uL — AB (ref 1.7–7.7)
Neutrophils Relative %: 79 %
Platelets: 325 10*3/uL (ref 150–400)
RBC: 3.75 MIL/uL — AB (ref 3.87–5.11)
RDW: 12.9 % (ref 11.5–15.5)
WBC: 13.8 10*3/uL — ABNORMAL HIGH (ref 4.0–10.5)

## 2018-05-31 LAB — BASIC METABOLIC PANEL
ANION GAP: 9 (ref 5–15)
BUN: <5 mg/dL — ABNORMAL LOW (ref 8–23)
CHLORIDE: 97 mmol/L — AB (ref 98–111)
CO2: 33 mmol/L — ABNORMAL HIGH (ref 22–32)
Calcium: 8.9 mg/dL (ref 8.9–10.3)
Creatinine, Ser: 0.47 mg/dL (ref 0.44–1.00)
GFR calc non Af Amer: >60 mL/min (ref >60)
Glucose, Bld: 113 mg/dL — ABNORMAL HIGH (ref 70–99)
POTASSIUM: 3.9 mmol/L (ref 3.5–5.1)
SODIUM: 139 mmol/L (ref 135–145)

## 2018-05-31 MED ORDER — SODIUM CHLORIDE 0.9 % IV SOLN
INTRAVENOUS | Status: DC
  Administered 2018-05-31 – 2018-06-02 (×5): via INTRAVENOUS

## 2018-05-31 MED ORDER — HYDROMORPHONE HCL 1 MG/ML IJ SOLN
1.0000 mg | Freq: Once | INTRAMUSCULAR | Status: AC
  Administered 2018-05-31: 1 mg via INTRAVENOUS
  Filled 2018-05-31: qty 1

## 2018-05-31 MED ORDER — POLYETHYLENE GLYCOL 3350 17 G PO PACK
17.0000 g | PACK | Freq: Two times a day (BID) | ORAL | Status: DC
  Administered 2018-05-31 – 2018-06-01 (×2): 17 g via ORAL
  Filled 2018-05-31 (×4): qty 1

## 2018-05-31 MED ORDER — TRAZODONE HCL 50 MG PO TABS
50.0000 mg | ORAL_TABLET | Freq: Once | ORAL | Status: AC
  Administered 2018-05-31: 50 mg via ORAL
  Filled 2018-05-31: qty 1

## 2018-05-31 MED ORDER — BISACODYL 10 MG RE SUPP
10.0000 mg | Freq: Once | RECTAL | Status: AC
  Administered 2018-05-31: 10 mg via RECTAL
  Filled 2018-05-31: qty 1

## 2018-05-31 NOTE — Progress Notes (Signed)
PROGRESS NOTE  MAGON CROSON AST:[id number] DOB: 04/09/1956 DOA: 05/26/2018 PCP: System, Pcp Not In  HPI/Recap of past 34 hours: 62 year old female with past medical history significant for alcohol abuse, recently diagnosed with alcoholic pancreatitis while on vacation presented with abdominal pain, nausea/vomiting.  Patient went to her PCP who did a CT scan that showed worsening pancreatitis.  In the ED, lipase was noted to be 30.  Due to significant nausea/vomiting with abdominal pain radiating to her back patient was admitted for further management.  Today, patient reports feeling better, still with abdominal pain, ??still requiring narcotics. No BM for the past 3 days. Denies any N/V, fever/chills, chest pain, diarrhea, dysuria.  Assessment/Plan: Principal Problem:   Alcoholic pancreatitis Active Problems:   Hypokalemia   HTN (hypertension), benign   Elevated blood-pressure reading without diagnosis of hypertension  Subacute alcoholic pancreatitis Improving Denied any recent alcohol consumption since her discharge on 05/23/2018 CT abdomen/pelvis on 05/26/2018 showed extensive inflammatory changes noted adjacent to the head of the pancreas, around the second, third and fourth portion of the duodenum, with inflammatory exudate tracking into the root of the small bowel mesentery, transverse mesocolon and caudally into the retroperitoneum with small probable pseudocyst in the retroperitoneum.  Trace amount of ascites, hepatic steatosis GI consulted, no further recs Continue IV fluids, narcotics Continue CLD Monitor closely  Leukocytosis Unclear etiology, no fever No signs of infection, denies dysuria, cough Monitor closely, daily CBC  Hypokalemia Resolved Replace as needed  Normocytic anemia Iron panel showed iron 9, sats 6, ferritin 343 S/P 1 dose of feraheme Monitor closely, daily CBC  Hypothyroidism Continue synthroid  Elevated BP BP now stable Monitor  closely    Code Status: Full  Family Communication: None at bedside  Disposition Plan: Once significantly improved   Consultants:  GI  Procedures:  None  Antimicrobials:  None  DVT prophylaxis: Lovenox   Objective: Vitals:   05/30/18 1405 05/30/18 2054 05/31/18 0501 05/31/18 1351  BP: (!) 142/77 (!) 141/72 (!) 132/91 134/71  Pulse: 88 92 90 96  Resp:  20 18 18   Temp: 98.3 F (36.8 C) 99.9 F (37.7 C) 99.2 F (37.3 C) 99.8 F (37.7 C)  TempSrc: Oral   Oral  SpO2: 92% 94% 98% 95%  Weight:      Height:        Intake/Output Summary (Last 24 hours) at 05/31/2018 1718 Last data filed at 05/31/2018 1500 Gross per 24 hour  Intake 480 ml  Output 700 ml  Net -220 ml   Filed Weights   05/26/18 1652  Weight: 72.6 kg (160 lb)    Exam:   General:  NAD   Cardiovascular: S1, S2 present  Respiratory: CTAB  Abdomen: Soft, mildly tender, mildly distended, BS present  Musculoskeletal: No pedal edema  Skin: Normal   Psychiatry: Normal mood   Data Reviewed: CBC: Recent Labs  Lab 05/27/18 0542 05/28/18 0939 05/29/18 0608 05/30/18 0552 05/31/18 0527  WBC 9.3 9.5 10.5 13.1* 13.8*  NEUTROABS  --  7.2 8.2* 10.6* 11.0*  HGB 10.4* 10.1* 9.8* 9.9* 11.3*  HCT 31.2* 31.1* 30.1* 30.3* 35.7*  MCV 94.3 95.4 95.0 94.1 95.2  PLT 268 247 317 278 989   Basic Metabolic Panel: Recent Labs  Lab 05/26/18 1755 05/27/18 0542 05/28/18 0939 05/29/18 0608 05/30/18 0552 05/31/18 0527  NA  --  139 139 136 134* 139  K  --  3.1* 3.5 3.4* 3.5 3.9  CL  --  100 100 98 94*  97*  CO2  --  29 28 29 31  33*  GLUCOSE  --  99 88 111* 112* 113*  BUN  --  8 6* 5* <5* <5*  CREATININE  --  0.45 0.51 0.45 0.44 0.47  CALCIUM  --  8.4* 8.4* 8.2* 8.3* 8.9  MG 2.0  --   --   --   --   --    GFR: Estimated Creatinine Clearance: 66.4 mL/min (by C-G formula based on SCr of 0.47 mg/dL). Liver Function Tests: Recent Labs  Lab 05/26/18 1700 05/27/18 0542  AST 17 16  ALT 16 14   ALKPHOS 69 58  BILITOT 1.2 0.8  PROT 6.3* 5.5*  ALBUMIN 2.7* 2.3*   Recent Labs  Lab 05/26/18 1700  LIPASE 33   No results for input(s): AMMONIA in the last 168 hours. Coagulation Profile: No results for input(s): INR, PROTIME in the last 168 hours. Cardiac Enzymes: No results for input(s): CKTOTAL, CKMB, CKMBINDEX, TROPONINI in the last 168 hours. BNP (last 3 results) No results for input(s): PROBNP in the last 8760 hours. HbA1C: No results for input(s): HGBA1C in the last 72 hours. CBG: No results for input(s): GLUCAP in the last 168 hours. Lipid Profile: Recent Labs    05/29/18 1607  TRIG 176*   Thyroid Function Tests: No results for input(s): TSH, T4TOTAL, FREET4, T3FREE, THYROIDAB in the last 72 hours. Anemia Panel: Recent Labs    05/29/18 0608  FERRITIN 343*  TIBC 163*  IRON 9*   Urine analysis:    Component Value Date/Time   COLORURINE YELLOW 05/27/2018 0014   APPEARANCEUR HAZY (A) 05/27/2018 0014   LABSPEC 1.017 05/27/2018 0014   PHURINE 6.0 05/27/2018 0014   GLUCOSEU NEGATIVE 05/27/2018 0014   HGBUR SMALL (A) 05/27/2018 0014   BILIRUBINUR NEGATIVE 05/27/2018 0014   KETONESUR 80 (A) 05/27/2018 0014   PROTEINUR 30 (A) 05/27/2018 0014   NITRITE NEGATIVE 05/27/2018 0014   LEUKOCYTESUR NEGATIVE 05/27/2018 0014   Sepsis Labs: @LABRCNTIP (procalcitonin:4,lacticidven:4)  )No results found for this or any previous visit (from the past 240 hour(s)).    Studies: No results found.  Scheduled Meds: . enoxaparin (LOVENOX) injection  40 mg Subcutaneous Q24H  . levothyroxine  100 mcg Oral QAC breakfast  . polyethylene glycol  17 g Oral BID  . senna-docusate  1 tablet Oral BID    Continuous Infusions: . 0.9 % NaCl with KCl 40 mEq / L 100 mL/hr (05/31/18 1109)     LOS: 5 days     Alma Friendly, MD [facility]   If 7PM-7AM, please contact night-coverage [email or web address] Password [facility] 05/31/2018, 5:18 PM

## 2018-06-01 ENCOUNTER — Inpatient Hospital Stay (HOSPITAL_COMMUNITY): Payer: BLUE CROSS/BLUE SHIELD

## 2018-06-01 LAB — URINALYSIS, ROUTINE W REFLEX MICROSCOPIC
Bilirubin Urine: NEGATIVE
Glucose, UA: NEGATIVE mg/dL
Hgb urine dipstick: NEGATIVE
Ketones, ur: 20 mg/dL — AB
LEUKOCYTES UA: NEGATIVE
Nitrite: NEGATIVE
PH: 8 (ref 5.0–8.0)
Protein, ur: NEGATIVE mg/dL
SPECIFIC GRAVITY, URINE: 1.008 (ref 1.005–1.030)

## 2018-06-01 LAB — CBC WITH DIFFERENTIAL/PLATELET
BAND NEUTROPHILS: 1 %
Basophils Absolute: 0 10*3/uL (ref 0.0–0.1)
Basophils Relative: 0 %
EOS ABS: 0.1 10*3/uL (ref 0.0–0.7)
Eosinophils Relative: 1 %
HCT: 33.3 % — ABNORMAL LOW (ref 36.0–46.0)
Hemoglobin: 10.7 g/dL — ABNORMAL LOW (ref 12.0–15.0)
LYMPHS PCT: 9 %
Lymphs Abs: 1.2 10*3/uL (ref 0.7–4.0)
MCH: 30.6 pg (ref 26.0–34.0)
MCHC: 32.1 g/dL (ref 30.0–36.0)
MCV: 95.1 fL (ref 78.0–100.0)
MONO ABS: 0.7 10*3/uL (ref 0.1–1.0)
Monocytes Relative: 5 %
NEUTROS ABS: 11.2 10*3/uL — AB (ref 1.7–7.7)
Neutrophils Relative %: 84 %
Platelets: 333 10*3/uL (ref 150–400)
RBC: 3.5 MIL/uL — ABNORMAL LOW (ref 3.87–5.11)
RDW: 13.1 % (ref 11.5–15.5)
WBC: 13.2 10*3/uL — ABNORMAL HIGH (ref 4.0–10.5)

## 2018-06-01 LAB — BASIC METABOLIC PANEL
Anion gap: 11 (ref 5–15)
BUN: <5 mg/dL — ABNORMAL LOW (ref 8–23)
CALCIUM: 8.5 mg/dL — AB (ref 8.9–10.3)
CO2: 27 mmol/L (ref 22–32)
Chloride: 97 mmol/L — ABNORMAL LOW (ref 98–111)
Creatinine, Ser: 0.47 mg/dL (ref 0.44–1.00)
GFR calc non Af Amer: >60 mL/min (ref >60)
Glucose, Bld: 112 mg/dL — ABNORMAL HIGH (ref 70–99)
Potassium: 4 mmol/L (ref 3.5–5.1)
SODIUM: 135 mmol/L (ref 135–145)

## 2018-06-01 LAB — TSH: TSH: 1.689 u[IU]/mL (ref 0.350–4.500)

## 2018-06-01 LAB — T4, FREE: Free T4: 1.64 ng/dL (ref 0.82–1.77)

## 2018-06-01 MED ORDER — TRAZODONE HCL 50 MG PO TABS
50.0000 mg | ORAL_TABLET | Freq: Every evening | ORAL | Status: DC | PRN
  Administered 2018-06-01: 50 mg via ORAL
  Filled 2018-06-01: qty 1

## 2018-06-01 MED ORDER — HYDROMORPHONE HCL 1 MG/ML IJ SOLN
1.0000 mg | Freq: Once | INTRAMUSCULAR | Status: AC
  Administered 2018-06-01: 1 mg via INTRAVENOUS
  Filled 2018-06-01: qty 1

## 2018-06-01 NOTE — Progress Notes (Signed)
PROGRESS NOTE  [name] YJE:[id number] DOB: Jul 04, 1956 DOA: 05/26/2018 PCP: System, Pcp Not In  HPI/Recap of past 35 hours: 62 year old female with past medical history significant for alcohol abuse, recently diagnosed with alcoholic pancreatitis while on vacation presented with abdominal pain, nausea/vomiting.  Patient went to her PCP who did a CT scan that showed worsening pancreatitis.  In the ED, lipase was noted to be 30.  Due to significant nausea/vomiting with abdominal pain radiating to her back patient was admitted for further management.  Today, patient reports feeling much better, still with abdominal pain, ??still requiring narcotics. Had BM. Denies any N/V, fever/chills, chest pain, diarrhea, dysuria.  Assessment/Plan: Principal Problem:   Alcoholic pancreatitis Active Problems:   Hypokalemia   HTN (hypertension), benign   Elevated blood-pressure reading without diagnosis of hypertension  Subacute alcoholic pancreatitis Improving Denied any recent alcohol consumption since her discharge on 05/23/2018 CT abdomen/pelvis on 05/26/2018 showed extensive inflammatory changes noted adjacent to the head of the pancreas, around the second, third and fourth portion of the duodenum, with inflammatory exudate tracking into the root of the small bowel mesentery, transverse mesocolon and caudally into the retroperitoneum with small probable pseudocyst in the retroperitoneum.  Trace amount of ascites, hepatic steatosis GI consulted, no further recs Continue IV fluids, narcotics Continue FLD  Leukocytosis Unclear etiology, no fever, asymptomatic No signs of infection, denies dysuria, cough UA, CXR pending Monitor closely  Hypokalemia Resolved Replace as needed  Normocytic anemia Iron panel showed iron 9, sats 6, ferritin 343 S/P 1 dose of feraheme Monitor closely, daily CBC  Hypothyroidism Continue synthroid  Elevated BP BP now stable Monitor closely    Code Status:  Full  Family Communication: None at bedside  Disposition Plan: Likely home on 06/02/18   Consultants:  GI  Procedures:  None  Antimicrobials:  None  DVT prophylaxis: Lovenox   Objective: Vitals:   05/31/18 1351 05/31/18 2017 06/01/18 0525 06/01/18 1440  BP: 134/71 116/69 120/73 113/69  Pulse: 96 92 93 90  Resp: 18 19 14 12   Temp: 99.8 F (37.7 C) 99.6 F (37.6 C) 99.2 F (37.3 C) 99.1 F (37.3 C)  TempSrc: Oral Oral Oral Oral  SpO2: 95% 95% 96% 96%  Weight:      Height:        Intake/Output Summary (Last 24 hours) at 06/01/2018 1736 Last data filed at 06/01/2018 1500 Gross per 24 hour  Intake 2745 ml  Output -  Net 2745 ml   Filed Weights   05/26/18 1652  Weight: 72.6 kg (160 lb)    Exam:   General:  NAD   Cardiovascular: S1, S2 present  Respiratory: CTAB  Abdomen: Soft, mildly tender, mildly distended, BS present  Musculoskeletal: No pedal edema  Skin: Normal   Psychiatry: Normal mood   Data Reviewed: CBC: Recent Labs  Lab 05/28/18 0939 05/29/18 0608 05/30/18 0552 05/31/18 0527 06/01/18 0553  WBC 9.5 10.5 13.1* 13.8* 13.2*  NEUTROABS 7.2 8.2* 10.6* 11.0* 11.2*  HGB 10.1* 9.8* 9.9* 11.3* 10.7*  HCT 31.1* 30.1* 30.3* 35.7* 33.3*  MCV 95.4 95.0 94.1 95.2 95.1  PLT 247 317 278 325 637   Basic Metabolic Panel: Recent Labs  Lab 05/26/18 1755  05/28/18 0939 05/29/18 0608 05/30/18 0552 05/31/18 0527 06/01/18 0553  NA  --    < > 139 136 134* 139 135  K  --    < > 3.5 3.4* 3.5 3.9 4.0  CL  --    < >  100 98 94* 97* 97*  CO2  --    < > 28 29 31  33* 27  GLUCOSE  --    < > 88 111* 112* 113* 112*  BUN  --    < > 6* 5* <5* <5* <5*  CREATININE  --    < > 0.51 0.45 0.44 0.47 0.47  CALCIUM  --    < > 8.4* 8.2* 8.3* 8.9 8.5*  MG 2.0  --   --   --   --   --   --    < > = values in this interval not displayed.   GFR: Estimated Creatinine Clearance: 66.4 mL/min (by C-G formula based on SCr of 0.47 mg/dL). Liver Function Tests: Recent  Labs  Lab 05/26/18 1700 05/27/18 0542  AST 17 16  ALT 16 14  ALKPHOS 69 58  BILITOT 1.2 0.8  PROT 6.3* 5.5*  ALBUMIN 2.7* 2.3*   Recent Labs  Lab 05/26/18 1700  LIPASE 33   No results for input(s): AMMONIA in the last 168 hours. Coagulation Profile: No results for input(s): INR, PROTIME in the last 168 hours. Cardiac Enzymes: No results for input(s): CKTOTAL, CKMB, CKMBINDEX, TROPONINI in the last 168 hours. BNP (last 3 results) No results for input(s): PROBNP in the last 8760 hours. HbA1C: No results for input(s): HGBA1C in the last 72 hours. CBG: No results for input(s): GLUCAP in the last 168 hours. Lipid Profile: No results for input(s): CHOL, HDL, LDLCALC, TRIG, CHOLHDL, LDLDIRECT in the last 72 hours. Thyroid Function Tests: Recent Labs    06/01/18 0553  TSH 1.689  FREET4 1.64   Anemia Panel: No results for input(s): VITAMINB12, FOLATE, FERRITIN, TIBC, IRON, RETICCTPCT in the last 72 hours. Urine analysis:    Component Value Date/Time   COLORURINE YELLOW 05/27/2018 0014   APPEARANCEUR HAZY (A) 05/27/2018 0014   LABSPEC 1.017 05/27/2018 0014   PHURINE 6.0 05/27/2018 0014   GLUCOSEU NEGATIVE 05/27/2018 0014   HGBUR SMALL (A) 05/27/2018 0014   BILIRUBINUR NEGATIVE 05/27/2018 0014   KETONESUR 80 (A) 05/27/2018 0014   PROTEINUR 30 (A) 05/27/2018 0014   NITRITE NEGATIVE 05/27/2018 0014   LEUKOCYTESUR NEGATIVE 05/27/2018 0014   Sepsis Labs: @LABRCNTIP (procalcitonin:4,lacticidven:4)  )No results found for this or any previous visit (from the past 240 hour(s)).    Studies: No results found.  Scheduled Meds: . enoxaparin (LOVENOX) injection  40 mg Subcutaneous Q24H  . levothyroxine  100 mcg Oral QAC breakfast  . polyethylene glycol  17 g Oral BID  . senna-docusate  1 tablet Oral BID    Continuous Infusions: . sodium chloride 100 mL/hr at 06/01/18 1217     LOS: 6 days     Alma Friendly, MD [facility]   If 7PM-7AM, please  contact night-coverage [email or web address] Password [facility] 06/01/2018, 5:36 PM

## 2018-06-02 ENCOUNTER — Telehealth: Payer: Self-pay

## 2018-06-02 DIAGNOSIS — R030 Elevated blood-pressure reading, without diagnosis of hypertension: Secondary | ICD-10-CM

## 2018-06-02 MED ORDER — HYDROCODONE-ACETAMINOPHEN 5-325 MG PO TABS
1.0000 | ORAL_TABLET | Freq: Four times a day (QID) | ORAL | Status: DC | PRN
  Administered 2018-06-02: 1 via ORAL
  Filled 2018-06-02: qty 1

## 2018-06-02 MED ORDER — POLYETHYLENE GLYCOL 3350 17 G PO PACK: 17.0000 g | PACK | Freq: Every day | ORAL | 0 refills | Status: DC

## 2018-06-02 MED ORDER — SODIUM CHLORIDE 0.9 % IV SOLN
510.0000 mg | Freq: Once | INTRAVENOUS | Status: AC
  Administered 2018-06-02: 510 mg via INTRAVENOUS
  Filled 2018-06-02: qty 17

## 2018-06-02 MED ORDER — HYDROCODONE-ACETAMINOPHEN 5-325 MG PO TABS: 1.0000 | ORAL_TABLET | Freq: Four times a day (QID) | ORAL | 0 refills | Status: AC | PRN

## 2018-06-02 MED ORDER — SENNOSIDES-DOCUSATE SODIUM 8.6-50 MG PO TABS: 1.0000 | ORAL_TABLET | Freq: Every day | ORAL | 0 refills | Status: DC

## 2018-06-02 MED ORDER — ONDANSETRON 4 MG PO TBDP: 4.0000 mg | ORAL_TABLET | Freq: Three times a day (TID) | ORAL | 0 refills | Status: DC | PRN

## 2018-06-02 NOTE — Progress Notes (Signed)
    Progress Note   Subjective  Chief Complaint: Acute etoh pancreatitis  Asked to see the patient today prior to discharge as patient did have some questions. Currently tolerating a full liquid diet. 6-7/10 epigastric pain, exacerbated when patient gets up to walk. Some continued nausea , "if I eat too much". Does have multiple questions today.    Objective   Vital signs in last 24 hours: Temp:  [99 F (37.2 C)-99.3 F (37.4 C)] 99 F (37.2 C) (07/15 0438) Pulse Rate:  [82-90] 82 (07/15 0438) Resp:  [12-19] 19 (07/15 0438) BP: (113-145)/(69-75) 145/74 (07/15 0438) SpO2:  [96 %-99 %] 97 % (07/15 0438) Last BM Date: 06/02/18 General:    white female in NAD Heart:  Regular rate and rhythm; no murmurs Lungs: Respirations even and unlabored, lungs CTA bilaterally Abdomen:  Soft, mil epigastric ttp and nondistended. Normal bowel sounds. Extremities:  Without edema. Neurologic:  Alert and oriented,  grossly normal neurologically. Psych:  Cooperative. Normal mood and affect.  Intake/Output from previous day: 07/14 0701 - 07/15 0700 In: 2783.3 [P.O.:720; I.V.:2063.3] Out: -  Intake/Output this shift: Total I/O In: 240 [P.O.:240] Out: -   Lab Results: Recent Labs    05/31/18 0527 06/01/18 0553  WBC 13.8* 13.2*  HGB 11.3* 10.7*  HCT 35.7* 33.3*  PLT 325 333   BMET Recent Labs    05/31/18 0527 06/01/18 0553  NA 139 135  K 3.9 4.0  CL 97* 97*  CO2 33* 27  GLUCOSE 113* 112*  BUN <5* <5*  CREATININE 0.47 0.47  CALCIUM 8.9 8.5*   Studies/Results: Dg Chest Port 1 View  Result Date: 06/01/2018 CLINICAL DATA:  Leukocytosis. EXAM: PORTABLE CHEST 1 VIEW COMPARISON:  07/02/2005 FINDINGS: Midline trachea. Normal heart size and mediastinal contours. No pleural effusion or pneumothorax. Clear lungs. IMPRESSION: No active disease. Electronically Signed   By: Abigail Miyamoto M.D.   On: 06/01/2018 18:06     Assessment / Plan:   Assessment: 1. Acute etoh pancreatitis: pt slowly  improving day 7 of admission, ok to discharge  Plan: 1. Answered all of the patient's questions 2. She is requesting pain meds at discharge, will leave to hospitalist, but discussed possibly enough for 7 days with q6h dosing-if she needs further meds after that will need to discuss with PCP or our clinic after d/c 3. Explained that she should remain on full liquid diet at discharge for next 3 days or so and then advance as tolerated-spoke in detail about what that would mean for her, if at any point increase in pain/symptoms, she should back off 4. Also recommend she continue prn nausea meds at discharge 5. Discussed that she can move about at home as she sees fit, but nothing overly strenuous for the next 7 days or so and then as tolerated 6. Explained that we will arrange follow up in 4-6 weeks in our clinic, if she has problems before they she can let us know. 7. Did discuss case with Dr. Penny Pia. Thank you again for your kind consultation, patient may be discharged.    LOS: 7 days   Levin Erp  06/02/2018, 1:09 PM  Pager # [phone]

## 2018-06-02 NOTE — Discharge Summary (Signed)
Discharge Summary  [name] IOE:[id number] DOB: Apr 14, 1956  PCP: System, Pcp Not In  Admit date: 05/26/2018 Discharge date: 06/02/2018  Time spent: 45 mins  Recommendations for Outpatient Follow-up:  1. PCP- Pt encouraged to make an appointment within 1 week to follow up on labs 2. GI on 07/09/18  Discharge Diagnoses:  Active Hospital Problems   Diagnosis Date Noted  . Alcoholic pancreatitis 18/29/9371  . Elevated blood-pressure reading without diagnosis of hypertension 05/27/2018  . Hypokalemia 05/26/2018  . HTN (hypertension), benign 05/26/2018    Resolved Hospital Problems  No resolved problems to display.    Discharge Condition: Stable  Diet recommendation: Full liquid with gradual advancement  Vitals:   06/01/18 2011 06/02/18 0438  BP: 140/75 (!) 145/74  Pulse: 82 82  Resp: 18 19  Temp: 99.3 F (37.4 C) 99 F (37.2 C)  SpO2: 99% 97%    History of present illness:  62 year old female with past medical history significant for alcohol abuse, recently diagnosed with alcoholic pancreatitis while on vacation presented with abdominal pain, nausea/vomiting.  Patient went to her PCP who did a CT scan that showed worsening pancreatitis.  In the ED, lipase was noted to be 30.  Due to significant nausea/vomiting with abdominal pain radiating to her back patient was admitted for further management.  Today, patient reports feeling much better, still with very mild abdominal pain, but pt reported poor pain tolerance and has anxiety about leaving the hospital. Had a good BM. Denies any N/V, fever/chills, chest pain, diarrhea, dysuria.   Hospital Course:  Principal Problem:   Alcoholic pancreatitis Active Problems:   Hypokalemia   HTN (hypertension), benign   Elevated blood-pressure reading without diagnosis of hypertension  Subacute alcoholic pancreatitis Improving Denied any recent alcohol consumption since her discharge on 05/23/2018 CT abdomen/pelvis on 05/26/2018  showed extensive inflammatory changes noted adjacent to the head of the pancreas, around the second, third and fourth portion of the duodenum, with inflammatory exudate tracking into the root of the small bowel mesentery, transverse mesocolon and caudally into the retroperitoneum with small probable pseudocyst in the retroperitoneum.  Trace amount of ascites, hepatic steatosis GI consulted, no further recs, follow up scheduled Encouraged to stay adequately hydrated, narcotics prn and advance diet as tolerated PCP follow up  Leukocytosis Unclear etiology, no fever, asymptomatic No signs of infection, denies dysuria, cough UA and CXR both unremarkable PCP to follow up with repeat labs  Hypokalemia Resolved  Normocytic anemia Iron panel showed iron 9, sats 6, ferritin 343 S/P 2 doses of feraheme PCP to follow up   Hypothyroidism Continue synthroid  Elevated BP BP now stable     Procedures:  None  Consultations:  GI  Discharge Exam: BP (!) 145/74 (BP Location: Right Arm)   Pulse 82   Temp 99 F (37.2 C) (Oral)   Resp 19   Ht 5\' 1"  (1.549 m)   Wt 72.6 kg (160 lb)   LMP 10/27/2010   SpO2 97%   BMI 30.23 kg/m   General: NAD Cardiovascular: S1, S2 present Respiratory: CTAB   Discharge Instructions You were cared for by a hospitalist during your hospital stay. If you have any questions about your discharge medications or the care you received while you were in the hospital after you are discharged, you can call the unit and asked to speak with the hospitalist on call if the hospitalist that took care of you is not available. Once you are discharged, your primary care physician will handle  any further medical issues. Please note that NO REFILLS for any discharge medications will be authorized once you are discharged, as it is imperative that you return to your primary care physician (or establish a relationship with a primary care physician if you do not have one) for  your aftercare needs so that they can reassess your need for medications and monitor your lab values.  Discharge Instructions    Diet - low sodium heart healthy   Complete by:  As directed    Increase activity slowly   Complete by:  As directed      Allergies as of 06/02/2018   No Known Allergies     Medication List    STOP taking these medications   aspirin 325 MG EC tablet   methocarbamol 500 MG tablet Commonly known as:  ROBAXIN   oxyCODONE 5 MG immediate release tablet Commonly known as:  Oxy IR/ROXICODONE   oxyCODONE-acetaminophen 5-325 MG tablet Commonly known as:  PERCOCET/ROXICET     TAKE these medications   HYDROcodone-acetaminophen 5-325 MG tablet Commonly known as:  NORCO/VICODIN Take 1 tablet by mouth every 6 (six) hours as needed for up to 7 days for moderate pain or severe pain.   levothyroxine 100 MCG tablet Commonly known as:  SYNTHROID, LEVOTHROID Take 100 mcg by mouth daily before breakfast.   ondansetron 4 MG disintegrating tablet Commonly known as:  ZOFRAN-ODT Take 1 tablet (4 mg total) by mouth every 8 (eight) hours as needed for nausea or vomiting. What changed:  See the new instructions.   polyethylene glycol packet Commonly known as:  MIRALAX / GLYCOLAX Take 17 g by mouth daily.   senna-docusate 8.6-50 MG tablet Commonly known as:  Senokot-S Take 1 tablet by mouth at bedtime.      No Known Allergies Follow-up Information    Willia Craze, NP Follow up.   Specialty:  Gastroenterology Why:  Appointment made on 07/09/18 at 9:30am Contact information: [place] [address] [phone]            The results of significant diagnostics from this hospitalization (including imaging, microbiology, ancillary and laboratory) are listed below for reference.    Significant Diagnostic Studies: Dg Chest Port 1 View  Result Date: 06/01/2018 CLINICAL DATA:  Leukocytosis. EXAM: PORTABLE CHEST 1 VIEW COMPARISON:   07/02/2005 FINDINGS: Midline trachea. Normal heart size and mediastinal contours. No pleural effusion or pneumothorax. Clear lungs. IMPRESSION: No active disease. Electronically Signed   By: Abigail Miyamoto M.D.   On: 06/01/2018 18:06    Microbiology: No results found for this or any previous visit (from the past 240 hour(s)).   Labs: Basic Metabolic Panel: Recent Labs  Lab 05/26/18 1755  05/28/18 3810 05/29/18 1751 05/30/18 0552 05/31/18 0527 06/01/18 0553  NA  --    < > 139 136 134* 139 135  K  --    < > 3.5 3.4* 3.5 3.9 4.0  CL  --    < > 100 98 94* 97* 97*  CO2  --    < > 28 29 31  33* 27  GLUCOSE  --    < > 88 111* 112* 113* 112*  BUN  --    < > 6* 5* <5* <5* <5*  CREATININE  --    < > 0.51 0.45 0.44 0.47 0.47  CALCIUM  --    < > 8.4* 8.2* 8.3* 8.9 8.5*  MG 2.0  --   --   --   --   --   --    < > =  values in this interval not displayed.   Liver Function Tests: Recent Labs  Lab 05/26/18 1700 05/27/18 0542  AST 17 16  ALT 16 14  ALKPHOS 69 58  BILITOT 1.2 0.8  PROT 6.3* 5.5*  ALBUMIN 2.7* 2.3*   Recent Labs  Lab 05/26/18 1700  LIPASE 33   No results for input(s): AMMONIA in the last 168 hours. CBC: Recent Labs  Lab 05/28/18 0939 05/29/18 0608 05/30/18 0552 05/31/18 0527 06/01/18 0553  WBC 9.5 10.5 13.1* 13.8* 13.2*  NEUTROABS 7.2 8.2* 10.6* 11.0* 11.2*  HGB 10.1* 9.8* 9.9* 11.3* 10.7*  HCT 31.1* 30.1* 30.3* 35.7* 33.3*  MCV 95.4 95.0 94.1 95.2 95.1  PLT 247 317 278 325 333   Cardiac Enzymes: No results for input(s): CKTOTAL, CKMB, CKMBINDEX, TROPONINI in the last 168 hours. BNP: BNP (last 3 results) No results for input(s): BNP in the last 8760 hours.  ProBNP (last 3 results) No results for input(s): PROBNP in the last 8760 hours.  CBG: No results for input(s): GLUCAP in the last 168 hours.     Signed:  Alma Friendly, MD [facility] 06/02/2018, 1:56 PM

## 2018-06-02 NOTE — Telephone Encounter (Signed)
appt made with Tye Savoy for 8/21at 930 am.  Pt to be notified at discharge

## 2018-06-02 NOTE — Telephone Encounter (Signed)
-----   Message from Levin Erp, [place] sent at 06/02/2018  1:16 PM EDT ----- Regarding: Appt Please arrange follow up for acute etoh pancreatitis with Dr. Fuller Plan or Tye Savoy in clinic in 4-6 weeks.  Thanks-JLL

## 2018-06-06 ENCOUNTER — Telehealth: Payer: Self-pay | Admitting: Gastroenterology

## 2018-06-06 DIAGNOSIS — K852 Alcohol induced acute pancreatitis without necrosis or infection: Secondary | ICD-10-CM

## 2018-06-06 NOTE — Telephone Encounter (Signed)
Per Dr. Carlean Purl patient needs LFTs just prior to OV with Tye Savoy RNP Patient notified she will come on 8/19 or 8/20

## 2018-06-06 NOTE — Addendum Note (Signed)
Addended by: Marlon Pel on: 06/06/2018 10:52 AM   Modules accepted: Orders

## 2018-06-06 NOTE — Telephone Encounter (Signed)
Per Dr. Carlean Purl and Ellouise Newer, PA labs reviewed from PCP.  Patient does not need an earlier appt. Jessica at [facility] office notified

## 2018-06-06 NOTE — Telephone Encounter (Signed)
Spoke with the PCP's receptionist, they are going to fax me the labs they did this week for review

## 2018-06-17 ENCOUNTER — Telehealth: Payer: Self-pay | Admitting: Gastroenterology

## 2018-06-17 NOTE — Telephone Encounter (Signed)
Spoke with patient and reassured her that the appointment on 07/09/18 is soon enough. Explained that Dr. Carlean Purl has reviewed he labs from PCP and agrees as per previous phone note.

## 2018-07-08 ENCOUNTER — Other Ambulatory Visit (INDEPENDENT_AMBULATORY_CARE_PROVIDER_SITE_OTHER): Payer: BLUE CROSS/BLUE SHIELD

## 2018-07-08 DIAGNOSIS — K852 Alcohol induced acute pancreatitis without necrosis or infection: Secondary | ICD-10-CM

## 2018-07-08 LAB — HEPATIC FUNCTION PANEL
ALBUMIN: 3.5 g/dL (ref 3.5–5.2)
ALK PHOS: 80 U/L (ref 39–117)
ALT: 15 U/L (ref 0–35)
AST: 10 U/L (ref 0–37)
Bilirubin, Direct: 0.1 mg/dL (ref 0.0–0.3)
Total Bilirubin: 0.6 mg/dL (ref 0.2–1.2)
Total Protein: 6.8 g/dL (ref 6.0–8.3)

## 2018-07-09 ENCOUNTER — Ambulatory Visit: Payer: BLUE CROSS/BLUE SHIELD | Admitting: Nurse Practitioner

## 2018-07-09 ENCOUNTER — Encounter: Payer: Self-pay | Admitting: Nurse Practitioner

## 2018-07-09 VITALS — BP 110/60 | HR 76 | Ht 61.0 in | Wt 143.0 lb

## 2018-07-09 DIAGNOSIS — K859 Acute pancreatitis without necrosis or infection, unspecified: Secondary | ICD-10-CM | POA: Diagnosis not present

## 2018-07-09 DIAGNOSIS — R7989 Other specified abnormal findings of blood chemistry: Secondary | ICD-10-CM | POA: Diagnosis not present

## 2018-07-09 NOTE — Progress Notes (Signed)
Primary GI:  Lucio Edward, MD         Chief Complaint:  Hospital follow up   [place];   19.  62 year old female recently hospitalized with acute pancreatitis, ? alcohol related.  No evidence for gallstones pancreatitis based on labs. No imaging done.  (she was hospitalized at outside hospital a few days earlier and u/s reportedly unremarkable). However, an MRI done a few days following discharge does in fact show stones/sludge in the gallbladder.  Given this new information, this may have been biliary pancreatitis after all,  though it would have been more convincing if liver chemistries were abnormal or she had biliary duct dilation on imaging. Underlying pancreatitic neoplasm is always a consideration  -no longer drinking ETOH  -feeling much better, minimal residual abdominal discomfort now.  MRI a few days following discharge demonstrated enhancing soft tissue surrounding pancreatic head. While this may have been from the pancreatitis itself it probably warrants repeat imaging in near future to exclude underlying neoplasm. I will ask Dr. Fuller Plan to review MRI and give his thoughts.  -Surgical evaluation if another episode of pancreatitis, especially since it shouldn't be from ETOH the next time.     2. 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 Not clear why the IV iron given. Her hgb was slightly low (after IV fluids) but ferritin was in 300s.  After hospital discharge Ferritin was 1200 at PCP's office.  Recent repeat ferritin was 900. Hgb now in 13 range   - Will need to follow ferritin level. She will come for repeat lab late Sept. No longer drinking ETOH, pancreatitis resolving so expect level will decline. If remains elevated will need to rule out hemachromatosis.    HPI:    Patient is a 62 year old female who was hospitalized mid July with acute pancreatitis, felt to be alcohol  related.  Few days prior to admission she had been hospitalized at [facility] for abdominal pain ( ? Pancreatitis).  Patient was told that abdominal ultrasound was negative for stones, at [facility] her liver chemistries were normal, CT scan negative for biliary duct dilation.  Triglycerides were only 176, calcium normal.  She was not on any suspicious medications.  Patient is here for hospital follow-up. Following low fat diet. Abdominal pain has significantly improved. No ETOH since hospital discharge.   Patient seen by PCP following hospital discharge.  MRI obtained 06/06/18 and revealed severe fatty infiltration of the liver, stones/sludge in the gallbladder.  There was also enhancing soft tissue surrounding the pancreas which could have been pancreatitis but mass was not excluded.  No biliary duct dilation was seen.    In the hospital patient's hemoglobin got down to 9.9 but this was probably dilutional related to IV fluids received for pancreatitis.  Her baseline hemoglobin is around 13. Ferritin was 343.  Patient says she got IV iron in the hospital for unclear reasons.  PCP repeated labs a few days after hospital discharge and hemoglobin was back to 13.3.  Ferritin was apparently 1200. She had labs again on 06/27/18 and ferritin was 900, TIBC normal at 288, transferrin slightly low at 206.  MRI @  [facility] 06/06/18 IMPRESSION: 1. Severe fatty infiltration of the liver. 2. Stones or sludge in the gallbladder. 3. Enhancing soft tissue surrounding head of the pancreas could be due to pancreatitis changes or mass. 3. No biliary ductal dilatation and no filling defects in the common  bile duct  Electronically Signed by: Caryl Never   Past Medical History:  Diagnosis Date  . 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 Arthritis   . Golfer's elbow 2012  . Hypothyroidism   . Rotator cuff sprain   . Thyroid disease      Past Surgical History:  Procedure Laterality Date  . ANKLE SURGERY      screw in left ankle/ 2 surgeries  . [place]    1 time  . MOUTH SURGERY    . PARTIAL KNEE ARTHROPLASTY Left 11/09/2015   Procedure: LEFT UNICOMPARTMENTAL KNEE ARTHROPLASTY;  Surgeon: Mcarthur Rossetti, MD;  Location: [facility] ORS;  Service: Orthopedics;  Laterality: Left;  . THYROIDECTOMY, PARTIAL  1992  . WISDOM TOOTH EXTRACTION  1980   Family History  Problem Relation Age of Onset  . Diverticulitis Mother        intestines removed partial  . Diverticulitis Son        intestines removed partial  . Colon cancer Neg Hx   . Esophageal cancer Neg Hx   . Rectal cancer Neg Hx    Social History   Tobacco Use  . Smoking status: Never Smoker  . Smokeless tobacco: Never Used  Substance Use Topics  . Alcohol use: Not Currently    Alcohol/week: 7.0 standard drinks    Types: 7 Standard drinks or equivalent per week    Comment: socially, weekends, quit in 05/2018  . Drug use: No   Current Outpatient Medications  Medication Sig Dispense Refill  . HYDROcodone-acetaminophen (NORCO/VICODIN) 5-325 MG tablet Take 0.5 tablets by mouth 2 (two) times daily.    Marland Kitchen levothyroxine (SYNTHROID, LEVOTHROID) 100 MCG tablet Take 100 mcg by mouth daily before breakfast.     No current facility-administered medications for this visit.    No Known Allergies   Review of Systems: All systems reviewed and negative except where noted in HPI.   Creatinine clearance cannot be calculated (Patient's most recent lab result is older than the maximum 21 days allowed.)   Physical Exam:    Wt Readings from Last 3 Encounters:  07/09/18 143 lb (64.9 kg)  05/26/18 160 lb (72.6 kg)  11/09/15 163 lb 2 oz (74 kg)    BP 110/60   Pulse 76   Ht 5\' 1"  (1.549 m)   Wt 143 lb (64.9 kg)   LMP 10/27/2010   BMI 27.02 kg/m  Constitutional:  Pleasant female in no acute distress. Psychiatric: Normal mood and affect. Behavior is normal. EENT: Pupils normal.  Conjunctivae are normal. No scleral icterus. Neck  supple.  Cardiovascular: Normal rate, regular rhythm. No edema Pulmonary/chest: Effort normal and breath sounds normal. No wheezing, rales or rhonchi. Abdominal: Soft, nondistended, nontender. Bowel sounds active throughout. There are no masses palpable. No hepatomegaly. Neurological: Alert and oriented to person place and time. Skin: Skin is warm and dry. No rashes noted.  Tye Savoy, NP  07/09/2018, 10:06 AM

## 2018-07-09 NOTE — Patient Instructions (Signed)
If you are age 62 or older, your body mass index should be between 23-30. Your Body mass index is 27.02 kg/m. If this is out of the aforementioned range listed, please consider follow up with your Primary Care Provider.  If you are age 35 or younger, your body mass index should be between 19-25. Your Body mass index is 27.02 kg/m. If this is out of the aformentioned range listed, please consider follow up with your Primary Care Provider.   Your provider has requested that you go to the basement level for lab work on 08/08/18. Press "B" on the elevator. The lab is located at the first door on the left as you exit the elevator.  Follow up with me on  September 24,2019 at 9:30 am.  Thank you for choosing me and [facility].   Tye Savoy, NP

## 2018-07-12 ENCOUNTER — Encounter: Payer: Self-pay | Admitting: Nurse Practitioner

## 2018-07-14 NOTE — Progress Notes (Signed)
Reviewed and agree with initial management plan. Plan for repeat abdominal MR in 6 months for follow up of pancreatic abnormality.  Pricilla Riffle. Fuller Plan, MD [facility]

## 2018-07-16 ENCOUNTER — Telehealth: Payer: Self-pay

## 2018-07-16 NOTE — Telephone Encounter (Signed)
-----   Message from Willia Craze, NP sent at 07/14/2018 11:41 PM EDT ----- [name], please let patient know that I had a chance to show patient's MRCP to Dr. Fuller Plan. I thought he may want to repeat it in 6 months and he does. Would you please get this scheduled for her but if residual abd discomfort doesn't totally resolve in next 4-6 weeks then we may look to repeat it earlier. Thanks

## 2018-07-16 NOTE — Telephone Encounter (Signed)
Patient reports she has minimal pain. She states she is very happy about this and in agreement with the plan.

## 2018-08-08 ENCOUNTER — Other Ambulatory Visit (INDEPENDENT_AMBULATORY_CARE_PROVIDER_SITE_OTHER): Payer: BLUE CROSS/BLUE SHIELD

## 2018-08-08 DIAGNOSIS — K859 Acute pancreatitis without necrosis or infection, unspecified: Secondary | ICD-10-CM

## 2018-08-08 LAB — FERRITIN: Ferritin: 405.1 ng/mL — ABNORMAL HIGH (ref 10.0–291.0)

## 2018-08-08 NOTE — Addendum Note (Signed)
Addended by: Isaiah Serge D on: 08/08/2018 09:44 AM   Modules accepted: Orders

## 2018-08-09 LAB — IRON AND TIBC
Iron Saturation: 38 % (ref 15–55)
Iron: 106 ug/dL (ref 27–139)
Total Iron Binding Capacity: 278 ug/dL (ref 250–450)
UIBC: 172 ug/dL (ref 118–369)

## 2018-08-12 ENCOUNTER — Ambulatory Visit: Payer: BLUE CROSS/BLUE SHIELD | Admitting: Nurse Practitioner

## 2018-08-12 ENCOUNTER — Encounter: Payer: Self-pay | Admitting: Nurse Practitioner

## 2018-08-12 VITALS — BP 102/60 | HR 96 | Ht 61.0 in | Wt 146.2 lb

## 2018-08-12 DIAGNOSIS — K859 Acute pancreatitis without necrosis or infection, unspecified: Secondary | ICD-10-CM

## 2018-08-12 DIAGNOSIS — R7989 Other specified abnormal findings of blood chemistry: Secondary | ICD-10-CM | POA: Diagnosis not present

## 2018-08-12 NOTE — Patient Instructions (Addendum)
You have been scheduled for an MRI/MRCP at [facility] on 11/06/18. Your appointment time is 8 am. Please arrive 15 minutes prior to your appointment time for registration purposes. Please make certain not to have anything to eat or drink 4 hours prior to your test. In addition, if you have any metal in your body, have a pacemaker or defibrillator, please be sure to let your ordering physician know. This test typically takes 45 minutes to 1 hour to complete. Should you need to reschedule, please call [phone] to do so.  Your provider has requested that you go to the basement level for labwork on 09/23/18 and again on 12/5/219. Press "B" on the elevator. The lab is located at the first door on the left as you exit the elevator.  If you are age 85 or older, your body mass index should be between 23-30. Your Body mass index is 27.62 kg/m. If this is out of the aforementioned range listed, please consider follow up with your Primary Care Provider.  If you are age 57 or younger, your body mass index should be between 19-25. Your Body mass index is 27.62 kg/m. If this is out of the aformentioned range listed, please consider follow up with your Primary Care Provider.   Thank you for choosing me and [facility].   Tye Savoy, NP

## 2018-08-12 NOTE — Progress Notes (Signed)
Primary GI:  Sabrina Edward, MD  Chief Complaint:    Follow-up pancreatitis  IMPRESSION and PLAN:    62 year old female here for follow up on pancreatitis. Recently hospitalized with acute pancreatitis, ? alcohol related but post hospital MRCP showed gallstone sludge / stones. No biliary duct dilation, liver tests normal. She is back for recheck. No further abdominal pain, feels great.  -MRI 4 days post hospitalization showed enhancing soft tissue surrounding pancreatic head. Findings probably related to pancreatitis but she is scheduled for follow up MRCP in December . She will call in the interim for any recurrent abdominal pain.  -We discussed diet. She will slowly reintroduce some fats back into her diet.  -Patient will have one drink on special occasions for time being. Except for heavy drinking while on vacation in July she doesn't drink very much anyway. -no surgical referral for cholecystectomy at this point as she had other concurrent risk factors for pancreatitis.   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 Probably multifactorial.  Will repeat ferritin and a TIBC in December. If elevated with obtain labs for [facility] but seems unlikely as patient tells me levels were normal prior to recent hospital admission for pancreatitis during which time she also get an iron infusion. Liver tests are normal   HPI:     Patient is a 62 yo female who I saw late August for follow-up after being hospitalized with acute pancreatitis, possibly alcohol related. However, an MRCP ordered by PCP ~ 4 days post hospital discharge actually showed gallbladder stones / sludge so biliary source can't be excluded though her liver tests were normal and there was no biliary duct dilation on imaging.  When I saw her in clinic last month I recommended MRI in 6 months to follow up on enhancing soft tissue surrounding pancreatic head seen on last MRCP  It has also been noted that patient had elevated ferritin level , probably  multifactorial from EtOH, recent pancreatitis and recent IV iron infusion (not clear why she had that).  Ferritin was trending down but still elevated at 343 when I saw her.  Plan was to follow the ferritin level with repeat labs this month.  Most recent Ferritin on the 20th of this month was 405.    [name] feels great. She has no abdominal pain, asking to reintroduce some fat back in to her diet.  Patient had one Etoh drink since I last saw her.      Review of systems:     No chest pain, no SOB, no fevers, no urinary sx   Past Medical History:  Diagnosis Date  . 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 Arthritis   . Golfer's elbow 2012  . Hypothyroidism   . Rotator cuff sprain   . Thyroid disease     Patient's surgical history, family medical history, social history, medications and allergies were all reviewed in Epic   Creatinine clearance cannot be calculated (Patient's most recent lab result is older than the maximum 21 days allowed.)  Current Outpatient Medications  Medication Sig Dispense Refill  . acetaminophen (TYLENOL) 500 MG tablet Take 500 mg by mouth every 6 (six) hours as needed.    Marland Kitchen levothyroxine (SYNTHROID, LEVOTHROID) 100 MCG tablet Take 100 mcg by mouth daily before breakfast.     No current facility-administered medications for this visit.     Physical Exam:     BP 102/60   Pulse 96   Ht 5\' 1"  (1.549 m)   Wt 146  lb 3.2 oz (66.3 kg)   LMP 10/27/2010   BMI 27.62 kg/m   GENERAL:  Pleasant female in NAD PSYCH: : Cooperative, normal affect EENT:  conjunctiva pink, mucous membranes moist, neck supple without masses CARDIAC:  RRR, slight murmur heard, no peripheral edema PULM: Normal respiratory effort, lungs CTA bilaterally, no wheezing ABDOMEN:  Nondistended, soft, nontender. No obvious masses, no hepatomegaly,  normal bowel sounds SKIN:  turgor, no lesions seen Musculoskeletal:  Normal muscle tone, normal strength NEURO: Alert and oriented x 3, no  focal neurologic deficits   Tye Savoy , NP 08/12/2018, 1:40 PM

## 2018-08-15 ENCOUNTER — Encounter: Payer: Self-pay | Admitting: Nurse Practitioner

## 2018-08-18 NOTE — Progress Notes (Signed)
Reviewed and agree with initial management plan.  Takiyah Bohnsack T. Jennifermarie Franzen, MD FACG 

## 2018-11-06 ENCOUNTER — Ambulatory Visit (HOSPITAL_COMMUNITY): Admission: RE | Admit: 2018-11-06 | Payer: BLUE CROSS/BLUE SHIELD | Source: Ambulatory Visit

## 2018-11-07 ENCOUNTER — Other Ambulatory Visit: Payer: Self-pay | Admitting: Nurse Practitioner

## 2018-11-07 ENCOUNTER — Ambulatory Visit (HOSPITAL_COMMUNITY)
Admission: RE | Admit: 2018-11-07 | Discharge: 2018-11-07 | Disposition: A | Discharge status: Home / Self Care | Payer: BLUE CROSS/BLUE SHIELD | Source: Ambulatory Visit | Attending: Nurse Practitioner | Admitting: Nurse Practitioner

## 2018-11-07 DIAGNOSIS — Z8719 Personal history of other diseases of the digestive system: Secondary | ICD-10-CM | POA: Insufficient documentation

## 2018-11-07 DIAGNOSIS — R933 Abnormal findings on diagnostic imaging of other parts of digestive tract: Secondary | ICD-10-CM | POA: Insufficient documentation

## 2018-11-07 DIAGNOSIS — K859 Acute pancreatitis without necrosis or infection, unspecified: Secondary | ICD-10-CM

## 2018-11-07 LAB — POCT I-STAT CREATININE: Creatinine, Ser: 0.7 mg/dL (ref 0.44–1.00)

## 2018-11-07 MED ORDER — GADOBUTROL 1 MMOL/ML IV SOLN
6.0000 mL | Freq: Once | INTRAVENOUS | Status: AC | PRN
  Administered 2018-11-07: 7 mL via INTRAVENOUS

## 2018-11-11 ENCOUNTER — Telehealth: Payer: Self-pay | Admitting: Nurse Practitioner

## 2018-11-11 NOTE — Telephone Encounter (Signed)
See alternate note  

## 2018-11-11 NOTE — Telephone Encounter (Signed)
Pt returning call

## 2018-12-23 NOTE — Pre-Procedure Instructions (Addendum)
[name]  12/23/2018      [name], [name] [id number] Phone: [phone] Fax: [phone]    Your procedure is scheduled on December 30, 2018.  Report to [facility] Admitting at 600 AM.  Call this number if you have problems the morning of surgery:  [phone]   Remember:  Do not eat or drink after midnight.    Take these medicines the morning of surgery with A SIP OF WATER  Levothyroxine (synthroid) Tylenol-if needed Clonazepam (klonopin)- if needed  7 days prior to surgery STOP taking any Aspirin (unless otherwise instructed by your surgeon), Aleve, Naproxen, Ibuprofen, Motrin, Advil, Goody's, BC's, all herbal medications, fish oil, and all vitamins    Do not wear jewelry, make-up or nail polish.  Do not wear lotions, powders, or perfumes, or deodorant.  Do not shave 48 hours prior to surgery.   Do not bring valuables to the hospital.  [facility] is not responsible for any belongings or valuables.  Contacts, dentures or bridgework may not be worn into surgery.  Leave your suitcase in the car.  After surgery it may be brought to your room.  For patients admitted to the hospital, discharge time will be determined by your treatment team.  Patients discharged the day of surgery will not be allowed to drive home.    [place]- Preparing For Surgery  Before surgery, you can play an important role. Because skin is not sterile, your skin needs to be as free of germs as possible. You can reduce the number of germs on your skin by washing with CHG (chlorahexidine gluconate) Soap before surgery.  CHG is an antiseptic cleaner which kills germs and bonds with the skin to continue killing germs even after washing.    Oral Hygiene is also important to reduce your risk of infection.  Remember - BRUSH YOUR TEETH THE MORNING OF SURGERY WITH YOUR REGULAR TOOTHPASTE  Please do not use if  you have an allergy to CHG or antibacterial soaps. If your skin becomes reddened/irritated stop using the CHG.  Do not shave (including legs and underarms) for at least 48 hours prior to first CHG shower. It is OK to shave your face.  Please follow these instructions carefully.   1. Shower the NIGHT BEFORE SURGERY and the MORNING OF SURGERY with CHG.   2. If you chose to wash your hair, wash your hair first as usual with your normal shampoo.  3. After you shampoo, rinse your hair and body thoroughly to remove the shampoo.  4. Use CHG as you would any other liquid soap. You can apply CHG directly to the skin and wash gently with a scrungie or a clean washcloth.   5. Apply the CHG Soap to your body ONLY FROM THE NECK DOWN.  Do not use on open wounds or open sores. Avoid contact with your eyes, ears, mouth and genitals (private parts). Wash Face and genitals (private parts)  with your normal soap.  6. Wash thoroughly, paying special attention to the area where your surgery will be performed.  7. Thoroughly rinse your body with warm water from the neck down.  8. DO NOT shower/wash with your normal soap after using and rinsing off the CHG Soap.  9. Pat yourself dry with a CLEAN TOWEL.  10. Wear CLEAN PAJAMAS to bed the night before surgery, wear comfortable clothes the morning of  surgery  11. Place CLEAN SHEETS on your bed the night of your first shower and DO NOT SLEEP WITH PETS.  Day of Surgery:  Do not apply any deodorants/lotions.  Please wear clean clothes to the hospital/surgery center.   Remember to brush your teeth WITH YOUR REGULAR TOOTHPASTE.  Please read over the following fact sheets that you were given.

## 2018-12-24 ENCOUNTER — Encounter (HOSPITAL_COMMUNITY)
Admission: RE | Admit: 2018-12-24 | Discharge: 2018-12-24 | Disposition: A | Discharge status: Home / Self Care | Payer: Worker's Compensation | Source: Ambulatory Visit | Attending: Orthopedic Surgery | Admitting: Orthopedic Surgery

## 2018-12-24 ENCOUNTER — Encounter (HOSPITAL_COMMUNITY): Payer: Self-pay

## 2018-12-24 ENCOUNTER — Other Ambulatory Visit: Payer: Self-pay

## 2018-12-24 DIAGNOSIS — Z01812 Encounter for preprocedural laboratory examination: Secondary | ICD-10-CM | POA: Diagnosis present

## 2018-12-24 LAB — BASIC METABOLIC PANEL
Anion gap: 10 (ref 5–15)
BUN: 18 mg/dL (ref 8–23)
CO2: 26 mmol/L (ref 22–32)
Calcium: 9.2 mg/dL (ref 8.9–10.3)
Chloride: 105 mmol/L (ref 98–111)
Creatinine, Ser: 0.98 mg/dL (ref 0.44–1.00)
GFR calc Af Amer: >60 mL/min (ref >60)
GFR calc non Af Amer: >60 mL/min (ref >60)
Glucose, Bld: 122 mg/dL — ABNORMAL HIGH (ref 70–99)
Potassium: 3.9 mmol/L (ref 3.5–5.1)
Sodium: 141 mmol/L (ref 135–145)

## 2018-12-24 LAB — CBC
HCT: 41.7 % (ref 36.0–46.0)
Hemoglobin: 13.8 g/dL (ref 12.0–15.0)
MCH: 31.4 pg (ref 26.0–34.0)
MCHC: 33.1 g/dL (ref 30.0–36.0)
MCV: 95 fL (ref 80.0–100.0)
Platelets: 196 10*3/uL (ref 150–400)
RBC: 4.39 MIL/uL (ref 3.87–5.11)
RDW: 12.1 % (ref 11.5–15.5)
WBC: 7 10*3/uL (ref 4.0–10.5)
nRBC: 0 % (ref 0.0–0.2)

## 2018-12-24 LAB — SURGICAL PCR SCREEN
MRSA, PCR: NEGATIVE
Staphylococcus aureus: NEGATIVE

## 2018-12-24 NOTE — Progress Notes (Addendum)
PCP - Eldridge Abrahams, NP Cardiologist - pt denies  Chest x-ray - 05/2018 in EPIC EKG - 05/2018 in EPIC  Stress Test - pt denies ECHO - pt denies  Cardiac Cath - pt denies  Sleep Study - pt denies CPAP - n/a  Fasting Blood Sugar - n/a Checks Blood Sugar _____ times a day-n/a  Blood Thinner Instructions: n/a Aspirin Instructions: n/a  Anesthesia review: NO  Patient denies shortness of breath, fever, cough and chest pain at PAT appointment  Patient verbalized understanding of instructions that were given to them at the PAT appointment. Patient was also instructed that they will need to review over the PAT instructions again at home before surgery.

## 2018-12-29 ENCOUNTER — Encounter (HOSPITAL_COMMUNITY): Payer: Self-pay | Admitting: Certified Registered Nurse Anesthetist

## 2018-12-29 MED ORDER — CEFAZOLIN SODIUM-DEXTROSE 2-4 GM/100ML-% IV SOLN
2.0000 g | INTRAVENOUS | Status: DC
  Filled 2018-12-29: qty 100

## 2018-12-29 NOTE — H&P (Addendum)
Orthopaedic Trauma Service (OTS) H&P  Patient ID: OTHELL DILUZIO MRN: [id number] DOB/AGE: 25-Jan-1956 63 y.o.   HPI: [name] is an 63 y.o. female well known to the OTS for L ankle fracture that was treated operatively in 2006.  She has done very well and has healed without complication. She presented to the office with lateral ankle pain, over her hardware. Pt has exhausted all conservative measures without further improvement.  She presents today for removal of hardware of her left fibula.  Risks and benefits related to surgery have been reviewed with the patient and she wishes to proceed with surgery.  Past Medical History:  Diagnosis Date  . 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 Arthritis   . Golfer's elbow 2012  . Hypothyroidism   . Rotator cuff sprain   . Thyroid disease     Past Surgical History:  Procedure Laterality Date  . ANKLE SURGERY     screw in left ankle/ 2 surgeries  . [place]    1 time  . MOUTH SURGERY    . PARTIAL KNEE ARTHROPLASTY Left 11/09/2015   Procedure: LEFT UNICOMPARTMENTAL KNEE ARTHROPLASTY;  Surgeon: Mcarthur Rossetti, MD;  Location: [facility] ORS;  Service: Orthopedics;  Laterality: Left;  . THYROIDECTOMY, PARTIAL  1992  . WISDOM TOOTH EXTRACTION  1980    Family History  Problem Relation Age of Onset  . Diverticulitis Mother        intestines removed partial  . Diverticulitis Son        intestines removed partial  . Colon cancer Neg Hx   . Esophageal cancer Neg Hx   . Rectal cancer Neg Hx     Social History:  reports that she has never smoked. She has never used smokeless tobacco. She reports previous alcohol use of about 7.0 standard drinks of alcohol per week. She reports that she does not use drugs.  Allergies: No Known Allergies  Medications:  Current Meds  Medication Sig  . acetaminophen (TYLENOL) 500 MG tablet Take 500 mg by mouth every 6 (six) hours as needed for moderate pain.   .  clonazePAM (KLONOPIN) 0.5 MG tablet Take 0.5 mg by mouth daily as needed for anxiety.  Marland Kitchen levothyroxine (SYNTHROID, LEVOTHROID) 100 MCG tablet Take 100 mcg by mouth daily before breakfast.  . Multiple Vitamin (MULTIVITAMIN WITH MINERALS) TABS tablet Take 1 tablet by mouth daily.  . naproxen sodium (ALEVE) 220 MG tablet Take 220 mg by mouth daily as needed (pain).     No results found for this or any previous visit (from the past 48 hour(s)).  No results found.  Review of Systems  Constitutional: Negative for chills and fever.  Respiratory: Negative for shortness of breath and wheezing.   Cardiovascular: Negative for chest pain and palpitations.  Gastrointestinal: Negative for abdominal pain, nausea and vomiting.  Musculoskeletal: Positive for joint pain (Left ankle pain).  Neurological: Negative for tingling and tremors.     Last menstrual period 10/27/2010. Physical Exam Constitutional:      Appearance: Normal appearance. She is well-developed.  Cardiovascular:     Rate and Rhythm: Normal rate and regular rhythm.  Pulmonary:     Effort: Pulmonary effort is normal.  Musculoskeletal:     Comments: Left lower extremity Healed surgical wounds left ankle No significant swelling Excellent ankle range of motion is noted with 20 degrees of extension 25 degrees of flexion and 10 degrees of inversion and eversion  DPN, SPN, TN sensory functions intact EHL, FHL, lesser toe motor functions are intact Active ankle range of motion is intact Extremity is warm Palpable DP pulse Patient is discretely tender over her fibular hardware primarily in the distal aspect where the screw heads are palpable and visible No tenderness along the syndesmosis or joint line pain.  No medial sided tenderness is noted either  Skin:    General: Skin is warm and dry.     Capillary Refill: Capillary refill takes less than 2 seconds.  Neurological:     Mental Status: She is alert and oriented to person, place,  and time.     Coordination: Coordination is intact.     Gait: Gait is intact.  Psychiatric:        Attention and Perception: Attention normal.        Mood and Affect: Mood and affect normal.        Speech: Speech normal.     Imaging Three-view left ankle: Radiographic union left ankle fracture with retained tibial hardware.  Distalmost cancellus screws are prominent but without signs of frank loosening  Assessment/Plan:  63 year old white female with remote left ankle fracture status post ORIF with symptomatic hardware  -Symptomatic hardware left fibula status post ORIF left ankle fracture, remote  OR for removal of hardware  No restrictions postoperatively  Weight-bear as tolerated postoperatively with range of motion as tolerated postoperatively  Outpatient procedure  Risks and benefits have been reviewed with the patient.  She wishes to proceed with surgery   Okay for preoperative block to assist with postoperative pain management  - Pain management:  Short course of narcotic pain medication as needed for severe pain no more than 5 days worth.  We will also provide a 5-day prescription for ketorolac.  - DVT/PE prophylaxis:  Patient will not require pharmacologic DVT prophylaxis postoperatively - ID:   Perioperative antibiotics  - Dispo:  OR for removal of hardware  Outpatient procedure   Weightbearing: WBAT LLE Insicional and dressing care: Daily dressing changes with Dry dressings and Ace wrap starting on 01/01/2019 Orthopedic device(s): None Showering: Okay to start showering upon dressing change VTE prophylaxis: Ambulation Pain control: As noted above Follow - up plan: 2 weeks Contact information: Astrid Divine. Marcelino Scot MD, Jari Pigg PA-C   Jari Pigg, PA-C [phone] Loletha Grayer) 12/29/2018, 4:05 PM  [facility] [address] [place] [id number] [phone] Jenetta [phone] (F)    I have seen and examined the patient. I agree with  the findings above.  I discussed with the patient the risks and benefits of surgery, including the possibility of infection, nerve injury, vessel injury, wound breakdown, arthritis, symptomatic hardware, DVT/ PE, loss of motion, and need for further surgery among others.  We also specifically discussed the potential persistence of symptoms.  She acknowledged these risks and wished to proceed.   Rozanna Box, MD 12/30/2018 8:04 AM

## 2018-12-29 NOTE — Anesthesia Preprocedure Evaluation (Addendum)
Anesthesia Evaluation  Patient identified by MRN, date of birth, ID band Patient awake    Reviewed: Allergy & Precautions, NPO status , Patient's Chart, lab work & pertinent test results  Airway Mallampati: II  TM Distance: >3 FB Neck ROM: Full    Dental no notable dental hx. (+) Teeth Intact, Dental Advisory Given   Pulmonary neg pulmonary ROS,    Pulmonary exam normal breath sounds clear to auscultation       Cardiovascular Exercise Tolerance: Good hypertension, Normal cardiovascular exam Rhythm:Regular Rate:Normal     Neuro/Psych negative neurological ROS  negative psych ROS   GI/Hepatic negative GI ROS,   Endo/Other  Hypothyroidism   Renal/GU negative Renal ROS     Musculoskeletal  (+) Arthritis ,   Abdominal   Peds  Hematology negative hematology ROS (+)   Anesthesia Other Findings   Reproductive/Obstetrics                            Anesthesia Physical Anesthesia Plan  ASA: II  Anesthesia Plan: General   Post-op Pain Management:    Induction: Intravenous  PONV Risk Score and Plan: 3 and Treatment may vary due to age or medical condition, Ondansetron and Dexamethasone  Airway Management Planned: LMA  Additional Equipment:   Intra-op Plan:   Post-operative Plan:   Informed Consent: I have reviewed the patients History and Physical, chart, labs and discussed the procedure including the risks, benefits and alternatives for the proposed anesthesia with the patient or authorized representative who has indicated his/her understanding and acceptance.     Dental advisory given  Plan Discussed with: CRNA  Anesthesia Plan Comments: (Hardware removal Ankle L)        Anesthesia Quick Evaluation

## 2018-12-30 ENCOUNTER — Ambulatory Visit (HOSPITAL_COMMUNITY): Payer: Worker's Compensation | Admitting: Certified Registered Nurse Anesthetist

## 2018-12-30 ENCOUNTER — Encounter (HOSPITAL_COMMUNITY): Payer: Self-pay | Admitting: General Practice

## 2018-12-30 ENCOUNTER — Other Ambulatory Visit: Payer: Self-pay

## 2018-12-30 ENCOUNTER — Encounter (HOSPITAL_COMMUNITY)
Admission: RE | Disposition: A | Discharge status: Home / Self Care | Payer: Self-pay | Source: Ambulatory Visit | Attending: Orthopedic Surgery

## 2018-12-30 ENCOUNTER — Ambulatory Visit (HOSPITAL_COMMUNITY)
Admission: RE | Admit: 2018-12-30 | Discharge: 2018-12-30 | Disposition: A | Discharge status: Home / Self Care | Payer: Worker's Compensation | Source: Ambulatory Visit | Attending: Orthopedic Surgery | Admitting: Orthopedic Surgery

## 2018-12-30 ENCOUNTER — Ambulatory Visit (HOSPITAL_COMMUNITY): Payer: Worker's Compensation

## 2018-12-30 DIAGNOSIS — Z419 Encounter for procedure for purposes other than remedying health state, unspecified: Secondary | ICD-10-CM

## 2018-12-30 DIAGNOSIS — E039 Hypothyroidism, unspecified: Secondary | ICD-10-CM | POA: Diagnosis not present

## 2018-12-30 DIAGNOSIS — T8484XA Pain due to internal orthopedic prosthetic devices, implants and grafts, initial encounter: Secondary | ICD-10-CM | POA: Insufficient documentation

## 2018-12-30 DIAGNOSIS — Z79890 Hormone replacement therapy (postmenopausal): Secondary | ICD-10-CM | POA: Diagnosis not present

## 2018-12-30 DIAGNOSIS — I10 Essential (primary) hypertension: Secondary | ICD-10-CM | POA: Diagnosis not present

## 2018-12-30 DIAGNOSIS — Y831 Surgical operation with implant of artificial internal device as the cause of abnormal reaction of the patient, or of later complication, without mention of misadventure at the time of the procedure: Secondary | ICD-10-CM | POA: Insufficient documentation

## 2018-12-30 HISTORY — PX: HARDWARE REMOVAL: SHX979

## 2018-12-30 SURGERY — REMOVAL, HARDWARE
Anesthesia: General | Site: Ankle | Laterality: Left

## 2018-12-30 MED ORDER — GABAPENTIN 300 MG PO CAPS
300.0000 mg | ORAL_CAPSULE | Freq: Once | ORAL | Status: AC
  Administered 2018-12-30: 300 mg via ORAL
  Filled 2018-12-30: qty 1

## 2018-12-30 MED ORDER — SODIUM CHLORIDE 0.9 % IV SOLN
INTRAVENOUS | Status: DC | PRN
  Administered 2018-12-30: 25 ug/min via INTRAVENOUS

## 2018-12-30 MED ORDER — LACTATED RINGERS IV SOLN
INTRAVENOUS | Status: DC
  Administered 2018-12-30: 07:00:00 via INTRAVENOUS

## 2018-12-30 MED ORDER — ONDANSETRON HCL 4 MG/2ML IJ SOLN
INTRAMUSCULAR | Status: AC
  Filled 2018-12-30: qty 2

## 2018-12-30 MED ORDER — BUPIVACAINE HCL (PF) 0.5 % IJ SOLN
INTRAMUSCULAR | Status: AC
  Filled 2018-12-30: qty 30

## 2018-12-30 MED ORDER — HYDROMORPHONE HCL 1 MG/ML IJ SOLN
0.2500 mg | INTRAMUSCULAR | Status: DC | PRN
  Administered 2018-12-30 (×2): 0.5 mg via INTRAVENOUS

## 2018-12-30 MED ORDER — MIDAZOLAM HCL 2 MG/2ML IJ SOLN
INTRAMUSCULAR | Status: DC | PRN
  Administered 2018-12-30: 2 mg via INTRAVENOUS

## 2018-12-30 MED ORDER — HYDROMORPHONE HCL 1 MG/ML IJ SOLN
INTRAMUSCULAR | Status: AC
  Filled 2018-12-30: qty 1

## 2018-12-30 MED ORDER — FENTANYL CITRATE (PF) 250 MCG/5ML IJ SOLN
INTRAMUSCULAR | Status: AC
  Filled 2018-12-30: qty 5

## 2018-12-30 MED ORDER — PROPOFOL 10 MG/ML IV BOLUS
INTRAVENOUS | Status: DC | PRN
  Administered 2018-12-30: 200 mg via INTRAVENOUS

## 2018-12-30 MED ORDER — KETOROLAC TROMETHAMINE 15 MG/ML IJ SOLN
INTRAMUSCULAR | Status: AC
  Filled 2018-12-30: qty 1

## 2018-12-30 MED ORDER — DEXAMETHASONE SODIUM PHOSPHATE 10 MG/ML IJ SOLN
INTRAMUSCULAR | Status: DC | PRN
  Administered 2018-12-30: 5 mg via INTRAVENOUS

## 2018-12-30 MED ORDER — ACETAMINOPHEN 500 MG PO TABS
1000.0000 mg | ORAL_TABLET | Freq: Once | ORAL | Status: AC
  Administered 2018-12-30: 1000 mg via ORAL
  Filled 2018-12-30: qty 2

## 2018-12-30 MED ORDER — MEPERIDINE HCL 50 MG/ML IJ SOLN: 6.2500 mg | INTRAMUSCULAR | Status: DC | PRN

## 2018-12-30 MED ORDER — KETOROLAC TROMETHAMINE 10 MG PO TABS: 10.0000 mg | ORAL_TABLET | Freq: Four times a day (QID) | ORAL | 0 refills | Status: DC | PRN

## 2018-12-30 MED ORDER — 0.9 % SODIUM CHLORIDE (POUR BTL) OPTIME
TOPICAL | Status: DC | PRN
  Administered 2018-12-30: 1000 mL

## 2018-12-30 MED ORDER — LIDOCAINE 2% (20 MG/ML) 5 ML SYRINGE
INTRAMUSCULAR | Status: AC
  Filled 2018-12-30: qty 5

## 2018-12-30 MED ORDER — CHLORHEXIDINE GLUCONATE 4 % EX LIQD: 60.0000 mL | Freq: Once | CUTANEOUS | Status: DC

## 2018-12-30 MED ORDER — PROPOFOL 10 MG/ML IV BOLUS
INTRAVENOUS | Status: AC
  Filled 2018-12-30: qty 20

## 2018-12-30 MED ORDER — DEXAMETHASONE SODIUM PHOSPHATE 10 MG/ML IJ SOLN
INTRAMUSCULAR | Status: AC
  Filled 2018-12-30: qty 1

## 2018-12-30 MED ORDER — ONDANSETRON HCL 4 MG/2ML IJ SOLN
4.0000 mg | Freq: Once | INTRAMUSCULAR | Status: AC | PRN
  Administered 2018-12-30: 4 mg via INTRAVENOUS

## 2018-12-30 MED ORDER — HYDROCODONE-ACETAMINOPHEN 5-325 MG PO TABS: 1.0000 | ORAL_TABLET | Freq: Three times a day (TID) | ORAL | 0 refills | Status: DC | PRN

## 2018-12-30 MED ORDER — ONDANSETRON HCL 4 MG/2ML IJ SOLN
INTRAMUSCULAR | Status: DC | PRN
  Administered 2018-12-30: 4 mg via INTRAVENOUS

## 2018-12-30 MED ORDER — CELECOXIB 200 MG PO CAPS
200.0000 mg | ORAL_CAPSULE | Freq: Once | ORAL | Status: AC
  Administered 2018-12-30: 200 mg via ORAL
  Filled 2018-12-30: qty 1

## 2018-12-30 MED ORDER — ONDANSETRON 4 MG PO TBDP: 4.0000 mg | ORAL_TABLET | Freq: Three times a day (TID) | ORAL | 0 refills | Status: DC | PRN

## 2018-12-30 MED ORDER — HYDROCODONE-ACETAMINOPHEN 7.5-325 MG PO TABS: 1.0000 | ORAL_TABLET | Freq: Once | ORAL | Status: DC | PRN

## 2018-12-30 MED ORDER — KETOROLAC TROMETHAMINE 15 MG/ML IJ SOLN
15.0000 mg | Freq: Once | INTRAMUSCULAR | Status: AC
  Administered 2018-12-30: 15 mg via INTRAVENOUS

## 2018-12-30 MED ORDER — FENTANYL CITRATE (PF) 250 MCG/5ML IJ SOLN
INTRAMUSCULAR | Status: DC | PRN
  Administered 2018-12-30: 50 ug via INTRAVENOUS

## 2018-12-30 MED ORDER — BUPIVACAINE HCL (PF) 0.25 % IJ SOLN
INTRAMUSCULAR | Status: DC | PRN
  Administered 2018-12-30: 10 mL

## 2018-12-30 MED ORDER — LIDOCAINE 2% (20 MG/ML) 5 ML SYRINGE
INTRAMUSCULAR | Status: DC | PRN
  Administered 2018-12-30: 100 mg via INTRAVENOUS

## 2018-12-30 MED ORDER — BUPIVACAINE HCL (PF) 0.25 % IJ SOLN
INTRAMUSCULAR | Status: AC
  Filled 2018-12-30: qty 30

## 2018-12-30 MED ORDER — MIDAZOLAM HCL 2 MG/2ML IJ SOLN
INTRAMUSCULAR | Status: AC
  Filled 2018-12-30: qty 2

## 2018-12-30 MED FILL — ONDANSETRON ODT 4 MG TABLET: 4 | 7 days supply | Qty: 20 | Fill #0

## 2018-12-30 MED FILL — KETOROLAC 10 MG TABLET: 10 | 5 days supply | Qty: 20 | Fill #0

## 2018-12-30 MED FILL — HYDROCODON-APAP 5-325: 5-325 | 7 days supply | Qty: 21 | Fill #0

## 2018-12-30 SURGICAL SUPPLY — 53 items
BANDAGE ACE 4X5 VEL STRL LF (GAUZE/BANDAGES/DRESSINGS) ×3 IMPLANT
BANDAGE ACE 6X5 VEL STRL LF (GAUZE/BANDAGES/DRESSINGS) ×3 IMPLANT
BANDAGE ESMARK 6X9 LF (GAUZE/BANDAGES/DRESSINGS) ×1 IMPLANT
BNDG CMPR 9X6 STRL LF SNTH (GAUZE/BANDAGES/DRESSINGS) ×1
BNDG COHESIVE 6X5 TAN STRL LF (GAUZE/BANDAGES/DRESSINGS) ×3 IMPLANT
BNDG ESMARK 6X9 LF (GAUZE/BANDAGES/DRESSINGS) ×3
BNDG GAUZE ELAST 4 BULKY (GAUZE/BANDAGES/DRESSINGS) ×4 IMPLANT
BRUSH SCRUB SURG 4.25 DISP (MISCELLANEOUS) ×4 IMPLANT
COVER SURGICAL LIGHT HANDLE (MISCELLANEOUS) ×4 IMPLANT
COVER WAND RF STERILE (DRAPES) ×3 IMPLANT
DECANTER SPIKE VIAL GLASS SM (MISCELLANEOUS) ×2 IMPLANT
DRAPE C-ARM 42X72 X-RAY (DRAPES) ×2 IMPLANT
DRAPE C-ARMOR (DRAPES) ×3 IMPLANT
DRAPE U-SHAPE 47X51 STRL (DRAPES) ×3 IMPLANT
DRSG ADAPTIC 3X8 NADH LF (GAUZE/BANDAGES/DRESSINGS) ×3 IMPLANT
ELECT REM PT RETURN 9FT ADLT (ELECTROSURGICAL) ×3
ELECTRODE REM PT RTRN 9FT ADLT (ELECTROSURGICAL) ×1 IMPLANT
GAUZE SPONGE 4X4 12PLY STRL (GAUZE/BANDAGES/DRESSINGS) ×3 IMPLANT
GLOVE BIO SURGEON STRL SZ7.5 (GLOVE) ×3 IMPLANT
GLOVE BIO SURGEON STRL SZ8 (GLOVE) ×3 IMPLANT
GLOVE BIOGEL PI IND STRL 7.5 (GLOVE) ×1 IMPLANT
GLOVE BIOGEL PI IND STRL 8 (GLOVE) ×1 IMPLANT
GLOVE BIOGEL PI INDICATOR 7.5 (GLOVE) ×2
GLOVE BIOGEL PI INDICATOR 8 (GLOVE) ×2
GLOVE SURG SS PI 7.0 STRL IVOR (GLOVE) ×4 IMPLANT
GOWN STRL REUS W/ TWL LRG LVL3 (GOWN DISPOSABLE) ×2 IMPLANT
GOWN STRL REUS W/ TWL XL LVL3 (GOWN DISPOSABLE) ×1 IMPLANT
GOWN STRL REUS W/TWL LRG LVL3 (GOWN DISPOSABLE) ×3
GOWN STRL REUS W/TWL XL LVL3 (GOWN DISPOSABLE) ×6
KIT BASIN OR (CUSTOM PROCEDURE TRAY) ×3 IMPLANT
KIT TURNOVER KIT B (KITS) ×3 IMPLANT
MANIFOLD NEPTUNE II (INSTRUMENTS) ×3 IMPLANT
NEEDLE 22X1 1/2 (OR ONLY) (NEEDLE) ×2 IMPLANT
NS IRRIG 1000ML POUR BTL (IV SOLUTION) ×3 IMPLANT
PACK ORTHO EXTREMITY (CUSTOM PROCEDURE TRAY) ×3 IMPLANT
PAD ARMBOARD 7.5X6 YLW CONV (MISCELLANEOUS) ×6 IMPLANT
PADDING CAST COTTON 6X4 STRL (CAST SUPPLIES) ×3 IMPLANT
PENCIL SMOKE EVACUATOR (MISCELLANEOUS) ×2 IMPLANT
SPONGE LAP 18X18 X RAY DECT (DISPOSABLE) ×3 IMPLANT
STOCKINETTE IMPERVIOUS LG (DRAPES) ×3 IMPLANT
SUCTION FRAZIER HANDLE 10FR (MISCELLANEOUS)
SUCTION TUBE FRAZIER 10FR DISP (MISCELLANEOUS) IMPLANT
SUT ETHILON 3 0 PS 1 (SUTURE) ×2 IMPLANT
SUT PDS AB 2-0 CT1 27 (SUTURE) IMPLANT
SUT VIC AB 0 CT1 27 (SUTURE)
SUT VIC AB 0 CT1 27XBRD ANBCTR (SUTURE) IMPLANT
SUT VIC AB 2-0 CT1 27 (SUTURE) ×3
SUT VIC AB 2-0 CT1 TAPERPNT 27 (SUTURE) IMPLANT
TOWEL OR 17X26 10 PK STRL BLUE (TOWEL DISPOSABLE) ×4 IMPLANT
TUBE CONNECTING 12'X1/4 (SUCTIONS) ×1
TUBE CONNECTING 12X1/4 (SUCTIONS) ×2 IMPLANT
UNDERPAD 30X30 (UNDERPADS AND DIAPERS) ×3 IMPLANT
YANKAUER SUCT BULB TIP NO VENT (SUCTIONS) ×3 IMPLANT

## 2018-12-30 NOTE — Anesthesia Postprocedure Evaluation (Signed)
Anesthesia Post Note  Patient: [name]  Procedure(s) Performed: HARDWARE REMOVAL LEFT ANKLE (Left Ankle)     Patient location during evaluation: PACU Anesthesia Type: General Level of consciousness: awake and alert Pain management: pain level controlled Vital Signs Assessment: post-procedure vital signs reviewed and stable Respiratory status: spontaneous breathing, nonlabored ventilation, respiratory function stable and patient connected to nasal cannula oxygen Cardiovascular status: blood pressure returned to baseline and stable Postop Assessment: no apparent nausea or vomiting Anesthetic complications: no    Last Vitals:  Vitals:   12/30/18 1145 12/30/18 1200  BP: 124/76 138/75  Pulse: 73 69  Resp:    Temp:    SpO2: 96% 95%    Last Pain:  Vitals:   12/30/18 1045  TempSrc:   PainSc: 3                  Barnet Glasgow

## 2018-12-30 NOTE — Transfer of Care (Signed)
Immediate Anesthesia Transfer of Care Note  Patient: [name]  Procedure(s) Performed: HARDWARE REMOVAL LEFT ANKLE (Left Ankle)  Patient Location: PACU  Anesthesia Type:General  Level of Consciousness: awake, alert  and oriented  Airway & Oxygen Therapy: Patient Spontanous Breathing and Patient connected to nasal cannula oxygen  Post-op Assessment: Report given to RN and Post -op Vital signs reviewed and stable  Post vital signs: Reviewed and stable  Last Vitals:  Vitals Value Taken Time  BP 117/70 12/30/2018  9:23 AM  Temp    Pulse 90 12/30/2018  9:26 AM  Resp 16 12/30/2018  9:26 AM  SpO2 100 % 12/30/2018  9:26 AM  Vitals shown include unvalidated device data.  Last Pain:  Vitals:   12/30/18 0718  TempSrc:   PainSc: 0-No pain      Patients Stated Pain Goal: 2 (26/33/35 4562)  Complications: No apparent anesthesia complications

## 2018-12-30 NOTE — Anesthesia Procedure Notes (Signed)
Procedure Name: LMA Insertion Date/Time: 12/30/2018 8:19 AM Performed by: Alain Marion, CRNA Pre-anesthesia Checklist: Patient identified, Emergency Drugs available, Suction available and Patient being monitored Patient Re-evaluated:Patient Re-evaluated prior to induction Oxygen Delivery Method: Circle System Utilized Preoxygenation: Pre-oxygenation with 100% oxygen Induction Type: IV induction Ventilation: Mask ventilation without difficulty LMA: LMA inserted LMA Size: 4.0 Number of attempts: 1 Airway Equipment and Method: Bite block Placement Confirmation: positive ETCO2 Tube secured with: Tape Dental Injury: Teeth and Oropharynx as per pre-operative assessment

## 2018-12-30 NOTE — Op Note (Signed)
12/30/2018  9:45 AM  PATIENT:  [name]  63 y.o. female  PRE-OPERATIVE DIAGNOSIS:  symptomatic hardware left ankle  POST-OPERATIVE DIAGNOSIS:  symptomatic hardware left ankle  PROCEDURE:  Procedure(s): HARDWARE REMOVAL LEFT ANKLE (Left)  SURGEON:  Surgeon(s) and Role:    [place], MD - Primary  PHYSICIAN ASSISTANT: PA Student  ANESTHESIA:   general  EBL:  20 mL   BLOOD ADMINISTERED:none  DRAINS: none   LOCAL MEDICATIONS USED:  MARCAINE     SPECIMEN:  No Specimen  DISPOSITION OF SPECIMEN:  N/A  COUNTS:  YES  TOURNIQUET:  * No tourniquets in log *  DICTATION: .Note written in EPIC  PLAN OF CARE: Discharge to home after PACU  PATIENT DISPOSITION:  PACU - hemodynamically stable.   Delay start of Pharmacological VTE agent (>24hrs) due to surgical blood loss or risk of bleeding: not applicable  BRIEF SUMMARY OF INDICATION FOR PROCEDURE:  Patient is a pleasant 63 y.o. who underwent plate fixation of a fracture with subsequent healing. Despite conservative measures, hardware related symptoms have persisted. Therefore, I discussed the risks and benefits of surgical removal including infection, nerve or vessel injury, failure to alleviate symptoms, occult nonunion, re-fracture, DVT, PE, and multiple others. She did wish to proceed.  BRIEF SUMMARY OF PROCEDURE:  The patient was taken to the operating room after administration of 2 g of Ancef.  General anesthesia was induced. The left lower extremity was prepped and draped in usual sterile fashion.  No tourniquet was used during the procedure.  C-arm was brought in to confirm position of the hardware.  I remade the old distal incision and dissected sharply down to the plate, elevating the soft tissues. I identified and removed all screws without complication. I placed a freer over top of the plate and underneath with the sharp edge away from the periosteum to generate some mobility as there were small areas of  bone overgrowth.  The plate was gently rocked to assist with this and then extracted atraumatically. Final x-rays confirmed removal of all hardware and a healed fracture. The wound was irrigated thoroughly and closed in standard fashion with vicryl and nylon. A sterile gently compressive dressing was applied.  The patient was taken to the PACU in stable condition. A PA student assisted me throughout.  PROGNOSIS: Patient will be weightbearing as tolerated with aggressive active and passive motion of the knee and ankle. Bleeding would be anticipated. She may change or remove his dressing in 48 hours and shower. Patient will follow up in 10 days for removal of sutures.    Astrid Divine. Marcelino Scot, M.D.

## 2018-12-30 NOTE — Discharge Instructions (Addendum)
[facility] Discharge Instructions   General Discharge Instructions  WEIGHT BEARING STATUS: Weightbear as tolerated Left leg   RANGE OF MOTION/ACTIVITY: unrestricted range of motion left ankle and knee. Activity as tolerated  Wound Care: dressing changes starting on 01/01/2019. See below  Discharge Wound Care Instructions  Do NOT apply any ointments, solutions or lotions to pin sites or surgical wounds.  These prevent needed drainage and even though solutions like hydrogen peroxide kill bacteria, they also damage cells lining the pin sites that help fight infection.  Applying lotions or ointments can keep the wounds moist and can cause them to breakdown and open up as well. This can increase the risk for infection. When in doubt call the office.  Surgical incisions should be dressed daily.  If any drainage is noted, use one layer of adaptic, then gauze, Kerlix, and an ace wrap.  Once the incision is completely dry and without drainage, it may be left open to air out.  Showering may begin 36-48 hours later.  Cleaning gently with soap and water.  Traumatic wounds should be dressed daily as well.    One layer of adaptic, gauze, Kerlix, then ace wrap.  The adaptic can be discontinued once the draining has ceased    If you have a wet to dry dressing: wet the gauze with saline the squeeze as much saline out so the gauze is moist (not soaking wet), place moistened gauze over wound, then place a dry gauze over the moist one, followed by Kerlix wrap, then ace wrap.   Diet: as you were eating previously.  Can use over the counter stool softeners and bowel preparations, such as Miralax, to help with bowel movements.  Narcotics can be constipating.  Be sure to drink plenty of fluids  PAIN MEDICATION USE AND EXPECTATIONS  You have likely been given narcotic medications to help control your pain.  After a traumatic event that results in an fracture (broken bone) with or without  surgery, it is ok to use narcotic pain medications to help control one's pain.  We understand that everyone responds to pain differently and each individual patient will be evaluated on a regular basis for the continued need for narcotic medications. Ideally, narcotic medication use should last no more than 6-8 weeks (coinciding with fracture healing).   As a patient it is your responsibility as well to monitor narcotic medication use and report the amount and frequency you use these medications when you come to your office visit.   We would also advise that if you are using narcotic medications, you should take a dose prior to therapy to maximize you participation.  IF YOU ARE ON NARCOTIC MEDICATIONS IT IS NOT PERMISSIBLE TO OPERATE A MOTOR VEHICLE (MOTORCYCLE/CAR/TRUCK/MOPED) OR HEAVY MACHINERY DO NOT MIX NARCOTICS WITH OTHER CNS (CENTRAL NERVOUS SYSTEM) DEPRESSANTS SUCH AS ALCOHOL   STOP SMOKING OR USING NICOTINE PRODUCTS!!!!  As discussed nicotine severely impairs your body's ability to heal surgical and traumatic wounds but also impairs bone healing.  Wounds and bone heal by forming microscopic blood vessels (angiogenesis) and nicotine is a vasoconstrictor (essentially, shrinks blood vessels).  Therefore, if vasoconstriction occurs to these microscopic blood vessels they essentially disappear and are unable to deliver necessary nutrients to the healing tissue.  This is one modifiable factor that you can do to dramatically increase your chances of healing your injury.    (This means no smoking, no nicotine gum, patches, etc)  DO NOT USE NONSTEROIDAL ANTI-INFLAMMATORY DRUGS (NSAID'S)  Using products such as Advil (ibuprofen), Aleve (naproxen), Motrin (ibuprofen) for additional pain control during fracture healing can delay and/or prevent the healing response.  If you would like to take over the counter (OTC) medication, Tylenol (acetaminophen) is ok.  However, some narcotic medications that are given  for pain control contain acetaminophen as well. Therefore, you should not exceed more than 4000 mg of tylenol in a day if you do not have liver disease.  Also note that there are may OTC medicines, such as cold medicines and allergy medicines that my contain tylenol as well.  If you have any questions about medications and/or interactions please ask your doctor/PA or your pharmacist.      ICE AND ELEVATE INJURED/OPERATIVE EXTREMITY  Using ice and elevating the injured extremity above your heart can help with swelling and pain control.  Icing in a pulsatile fashion, such as 20 minutes on and 20 minutes off, can be followed.    Do not place ice directly on skin. Make sure there is a barrier between to skin and the ice pack.    Using frozen items such as frozen peas works well as the conform nicely to the are that needs to be iced.  USE AN ACE WRAP OR TED HOSE FOR SWELLING CONTROL  In addition to icing and elevation, Ace wraps or TED hose are used to help limit and resolve swelling.  It is recommended to use Ace wraps or TED hose until you are informed to stop.    When using Ace Wraps start the wrapping distally (farthest away from the body) and wrap proximally (closer to the body)   Example: If you had surgery on your leg or thing and you do not have a splint on, start the ace wrap at the toes and work your way up to the thigh        If you had surgery on your upper extremity and do not have a splint on, start the ace wrap at your fingers and work your way up to the upper arm  IF YOU ARE IN A SPLINT OR CAST DO NOT [place]   If your splint gets wet for any reason please contact the office immediately. You may shower in your splint or cast as long as you keep it dry.  This can be done by wrapping in a cast cover or garbage back (or similar)  Do Not stick any thing down your splint or cast such as pencils, money, or hangers to try and scratch yourself with.  If you feel itchy take  benadryl as prescribed on the bottle for itching  IF YOU ARE IN A CAM BOOT (BLACK BOOT)  You may remove boot periodically. Perform daily dressing changes as noted below.  Wash the liner of the boot regularly and wear a sock when wearing the boot. It is recommended that you sleep in the boot until told otherwise  CALL THE OFFICE WITH ANY QUESTIONS OR CONCERNS: [phone]

## 2018-12-31 ENCOUNTER — Encounter (HOSPITAL_COMMUNITY): Payer: Self-pay | Admitting: Orthopedic Surgery

## 2019-10-19 ENCOUNTER — Encounter: Payer: Self-pay | Admitting: Gastroenterology

## 2019-10-28 ENCOUNTER — Encounter: Payer: Self-pay | Admitting: Gastroenterology

## 2019-11-26 ENCOUNTER — Ambulatory Visit (AMBULATORY_SURGERY_CENTER): Payer: BC Managed Care – PPO | Admitting: *Deleted

## 2019-11-26 ENCOUNTER — Other Ambulatory Visit: Payer: Self-pay

## 2019-11-26 VITALS — Temp 96.7°F | Ht 61.0 in | Wt 168.0 lb

## 2019-11-26 DIAGNOSIS — Z86010 Personal history of colonic polyps: Secondary | ICD-10-CM

## 2019-11-26 DIAGNOSIS — Z1159 Encounter for screening for other viral diseases: Secondary | ICD-10-CM

## 2019-11-26 MED ORDER — NA SULFATE-K SULFATE-MG SULF 17.5-3.13-1.6 GM/177ML PO SOLN: ORAL | 0 refills | Status: DC

## 2019-11-26 NOTE — Progress Notes (Signed)
Patient is here in-person for PV. Patient denies any allergies to eggs or soy. Patient denies any problems with anesthesia/sedation. Patient denies any oxygen use at home. Patient denies taking any diet/weight loss medications or blood thinners. Patient is not being treated for MRSA or C-diff. EMMI education assisgned to the patient for the procedure, this was explained and instructions given to patient. COVID-19 screening test is on 1/19, the pt is aware. Pt is aware that care partner will wait in the car during procedure; if they feel like they will be too hot or cold to wait in the car; they may wait in the 4 th floor lobby. Patient is aware to bring only one care partner. We want them to wear a mask (we do not have any that we can provide them), practice social distancing, and we will check their temperatures when they get here.  I did remind the patient that their care partner needs to stay in the parking lot the entire time and have a cell phone available, we will call them when the pt is ready for discharge. Patient will wear mask into building.    Suprep $15 off coupon given to the patient.

## 2019-12-02 ENCOUNTER — Encounter: Payer: Self-pay | Admitting: Gastroenterology

## 2019-12-08 ENCOUNTER — Ambulatory Visit (INDEPENDENT_AMBULATORY_CARE_PROVIDER_SITE_OTHER): Payer: BC Managed Care – PPO

## 2019-12-08 ENCOUNTER — Other Ambulatory Visit: Payer: Self-pay | Admitting: Gastroenterology

## 2019-12-08 DIAGNOSIS — Z1159 Encounter for screening for other viral diseases: Secondary | ICD-10-CM

## 2019-12-08 LAB — SARS CORONAVIRUS 2 (TAT 6-24 HRS): SARS Coronavirus 2: NEGATIVE

## 2019-12-10 ENCOUNTER — Other Ambulatory Visit: Payer: Self-pay

## 2019-12-10 ENCOUNTER — Encounter: Payer: Self-pay | Admitting: Gastroenterology

## 2019-12-10 ENCOUNTER — Ambulatory Visit (AMBULATORY_SURGERY_CENTER): Payer: BC Managed Care – PPO | Admitting: Gastroenterology

## 2019-12-10 VITALS — BP 122/59 | HR 62 | Temp 98.3°F | Resp 16 | Ht 61.0 in | Wt 168.0 lb

## 2019-12-10 DIAGNOSIS — K635 Polyp of colon: Secondary | ICD-10-CM

## 2019-12-10 DIAGNOSIS — Z86010 Personal history of colonic polyps: Secondary | ICD-10-CM

## 2019-12-10 DIAGNOSIS — D125 Benign neoplasm of sigmoid colon: Secondary | ICD-10-CM

## 2019-12-10 DIAGNOSIS — D123 Benign neoplasm of transverse colon: Secondary | ICD-10-CM

## 2019-12-10 MED ORDER — SODIUM CHLORIDE 0.9 % IV SOLN: 500.0000 mL | Freq: Once | INTRAVENOUS | Status: DC

## 2019-12-10 NOTE — Progress Notes (Signed)
Called to room to assist during endoscopic procedure.  Patient ID and intended procedure confirmed with present staff. Received instructions for my participation in the procedure from the performing physician.  

## 2019-12-10 NOTE — Op Note (Signed)
[facility] Patient Name: [name] Procedure Date: 12/10/2019 10:34 AM MRN: [id number] Endoscopist: Ladene Artist , MD Age: 64 Referring MD:  Date of Birth: [birth year] Gender: Female Account #: [id number] Procedure:                Colonoscopy Indications:              Surveillance: Personal history of adenomatous                            polyps on last colonoscopy 5 years ago Medicines:                Monitored Anesthesia Care Procedure:                Pre-Anesthesia Assessment:                           - Prior to the procedure, a History and Physical                            was performed, and patient medications and                            allergies were reviewed. The patient's tolerance of                            previous anesthesia was also reviewed. The risks                            and benefits of the procedure and the sedation                            options and risks were discussed with the patient.                            All questions were answered, and informed consent                            was obtained. Prior Anticoagulants: The patient has                            taken no previous anticoagulant or antiplatelet                            agents. ASA Grade Assessment: II - A patient with                            mild systemic disease. After reviewing the risks                            and benefits, the patient was deemed in                            satisfactory condition to undergo the procedure.  After obtaining informed consent, the colonoscope                            was passed under direct vision. Throughout the                            procedure, the patient's blood pressure, pulse, and                            oxygen saturations were monitored continuously. The                            Colonoscope was introduced through the anus and                            advanced to the the cecum,  identified by                            appendiceal orifice and ileocecal valve. The                            ileocecal valve, appendiceal orifice, and rectum                            were photographed. The quality of the bowel                            preparation was good. The colonoscopy was performed                            without difficulty. The patient tolerated the                            procedure well. Scope In: 10:44:25 AM Scope Out: 10:58:01 AM Scope Withdrawal Time: 0 hours 11 minutes 49 seconds  Total Procedure Duration: 0 hours 13 minutes 36 seconds  Findings:                 The perianal and digital rectal examinations were                            normal.                           Two sessile polyps were found in the sigmoid colon                            and transverse colon. The polyps were 5 to 7 mm in                            size. These polyps were removed with a cold snare.                            Resection and retrieval were complete.  A few small-mouthed diverticula were found in the                            left colon. There was no evidence of diverticular                            bleeding.                           The exam was otherwise without abnormality on                            direct and retroflexion views. Complications:            No immediate complications. Estimated blood loss:                            None. Estimated Blood Loss:     Estimated blood loss: none. Impression:               - Two 5 to 7 mm polyps in the sigmoid colon and in                            the transverse colon, removed with a cold snare.                            Resected and retrieved.                           - Mild diverticulosis in the left colon.                           - The examination was otherwise normal on direct                            and retroflexion views. Recommendation:           - Repeat  colonoscopy in 7 years for surveillance.                           - Patient has a contact number available for                            emergencies. The signs and symptoms of potential                            delayed complications were discussed with the                            patient. Return to normal activities tomorrow.                            Written discharge instructions were provided to the                            patient.                           -  High fiber diet.                           - Continue present medications.                           - Await pathology results. Ladene Artist, MD 12/10/2019 11:01:43 AM This report has been signed electronically.

## 2019-12-10 NOTE — Progress Notes (Signed)
Pt's states no medical or surgical changes since previsit or office visit.  JB - temp DT - vitals 

## 2019-12-10 NOTE — Patient Instructions (Signed)
Handouts given for polyps, diverticulosis and high fiber diet.  YOU HAD AN ENDOSCOPIC PROCEDURE TODAY AT [facility]:   Refer to the procedure report that was given to you for any specific questions about what was found during the examination.  If the procedure report does not answer your questions, please call your gastroenterologist to clarify.  If you requested that your care partner not be given the details of your procedure findings, then the procedure report has been included in a sealed envelope for you to review at your convenience later.  YOU SHOULD EXPECT: Some feelings of bloating in the abdomen. Passage of more gas than usual.  Walking can help get rid of the air that was put into your GI tract during the procedure and reduce the bloating. If you had a lower endoscopy (such as a colonoscopy or flexible sigmoidoscopy) you may notice spotting of blood in your stool or on the toilet paper. If you underwent a bowel prep for your procedure, you may not have a normal bowel movement for a few days.  Please Note:  You might notice some irritation and congestion in your nose or some drainage.  This is from the oxygen used during your procedure.  There is no need for concern and it should clear up in a day or so.  SYMPTOMS TO REPORT IMMEDIATELY:   Following lower endoscopy (colonoscopy or flexible sigmoidoscopy):  Excessive amounts of blood in the stool  Significant tenderness or worsening of abdominal pains  Swelling of the abdomen that is new, acute  Fever of 100F or higher  For urgent or emergent issues, a gastroenterologist can be reached at any hour by calling [phone].   DIET:  We do recommend a small meal at first, but then you may proceed to your regular diet.  Drink plenty of fluids but you should avoid alcoholic beverages for 24 hours.  ACTIVITY:  You should plan to take it easy for the rest of today and you should NOT DRIVE or use heavy machinery until  tomorrow (because of the sedation medicines used during the test).    FOLLOW UP: Our staff will call the number listed on your records 48-72 hours following your procedure to check on you and address any questions or concerns that you may have regarding the information given to you following your procedure. If we do not reach you, we will leave a message.  We will attempt to reach you two times.  During this call, we will ask if you have developed any symptoms of COVID 19. If you develop any symptoms (ie: fever, flu-like symptoms, shortness of breath, cough etc.) before then, please call [phone].  If you test positive for Covid 19 in the 2 weeks post procedure, please call and report this information to [place].    If any biopsies were taken you will be contacted by phone or by letter within the next 1-3 weeks.  Please call us at [phone] if you have not heard about the biopsies in 3 weeks.    SIGNATURES/CONFIDENTIALITY: You and/or your care partner have signed paperwork which will be entered into your electronic medical record.  These signatures attest to the fact that that the information above on your After Visit Summary has been reviewed and is understood.  Full responsibility of the confidentiality of this discharge information lies with you and/or your care-partner.

## 2019-12-10 NOTE — Progress Notes (Signed)
A and O x3. Report to RN. Tolerated MAC anesthesia well.

## 2019-12-14 ENCOUNTER — Telehealth: Payer: Self-pay

## 2019-12-14 NOTE — Telephone Encounter (Signed)
  Follow up Call-  Call back number 12/10/2019  Post procedure Call Back phone  # [phone]  Permission to leave phone message Yes  Some recent data might be hidden     Patient questions:  Do you have a fever, pain , or abdominal swelling? No. Pain Score  0 *  Have you tolerated food without any problems? Yes.    Have you been able to return to your normal activities? Yes.    Do you have any questions about your discharge instructions: Diet   No. Medications  No. Follow up visit  No.  Do you have questions or concerns about your Care? No.  Actions: * If pain score is 4 or above: No action needed, pain <4.  1. Have you developed a fever since your procedure? no  2.   Have you had an respiratory symptoms (SOB or cough) since your procedure? no  3.   Have you tested positive for COVID 19 since your procedure no  4.   Have you had any family members/close contacts diagnosed with the COVID 19 since your procedure?  no   If yes to any of these questions please route to Joylene John, RN and Alphonsa Gin, Therapist, sports.

## 2019-12-16 ENCOUNTER — Encounter: Payer: Self-pay | Admitting: Gastroenterology

## 2020-01-29 ENCOUNTER — Ambulatory Visit: Payer: BC Managed Care – PPO | Attending: Internal Medicine

## 2020-01-29 DIAGNOSIS — Z23 Encounter for immunization: Secondary | ICD-10-CM

## 2020-01-29 NOTE — Progress Notes (Signed)
   Covid-19 Vaccination Clinic  Name:  [name]    MRN: [id number] DOB: 05/25/1956  01/29/2020  [name] was observed post Covid-19 immunization for 15 minutes without incident. She was provided with Vaccine Information Sheet and instruction to access the V-Safe system.   [name] was instructed to call 911 with any severe reactions post vaccine: Marland Kitchen Difficulty breathing  . Swelling of face and throat  . A fast heartbeat  . A bad rash all over body  . Dizziness and weakness   Immunizations Administered    Name Date Dose VIS Date Route   Pfizer COVID-19 Vaccine 01/29/2020 11:08 AM 0.3 mL 10/30/2019 Intramuscular   Manufacturer: [place]   Lot: [id number]   [place]: [id number]

## 2020-02-22 ENCOUNTER — Ambulatory Visit: Payer: BC Managed Care – PPO | Attending: Internal Medicine

## 2020-02-22 DIAGNOSIS — Z23 Encounter for immunization: Secondary | ICD-10-CM

## 2020-02-22 NOTE — Progress Notes (Signed)
   Covid-19 Vaccination Clinic  Name:  [name]    MRN: [id number] DOB: 1956/05/10  02/22/2020  [name] was observed post Covid-19 immunization for 15 minutes without incident. She was provided with Vaccine Information Sheet and instruction to access the V-Safe system.   [name] was instructed to call 911 with any severe reactions post vaccine: Marland Kitchen Difficulty breathing  . Swelling of face and throat  . A fast heartbeat  . A bad rash all over body  . Dizziness and weakness   Immunizations Administered    Name Date Dose VIS Date Route   Pfizer COVID-19 Vaccine 02/22/2020  2:29 PM 0.3 mL 10/30/2019 Intramuscular   Manufacturer: [place]   Lot: [id number]   [place]: [id number]

## 2020-09-21 ENCOUNTER — Ambulatory Visit: Payer: BC Managed Care – PPO | Admitting: Professional

## 2022-04-24 ENCOUNTER — Ambulatory Visit: Payer: BC Managed Care – PPO | Admitting: Physician Assistant

## 2023-04-23 ENCOUNTER — Ambulatory Visit
Admission: EM | Admit: 2023-04-23 | Discharge: 2023-04-23 | Disposition: A | Discharge status: Home / Self Care | Payer: Medicare HMO

## 2023-04-23 DIAGNOSIS — J069 Acute upper respiratory infection, unspecified: Secondary | ICD-10-CM

## 2023-04-23 DIAGNOSIS — R0989 Other specified symptoms and signs involving the circulatory and respiratory systems: Secondary | ICD-10-CM

## 2023-04-23 MED ORDER — CETIRIZINE HCL 10 MG PO TABS: 10.0000 mg | ORAL_TABLET | Freq: Every day | ORAL | 0 refills | Status: AC

## 2023-04-23 MED ORDER — ACETAMINOPHEN 325 MG PO TABS: 650.0000 mg | ORAL_TABLET | Freq: Four times a day (QID) | ORAL | 0 refills | Status: AC | PRN

## 2023-04-23 MED ORDER — PROMETHAZINE-DM 6.25-15 MG/5ML PO SYRP: 5.0000 mL | ORAL_SOLUTION | Freq: Three times a day (TID) | ORAL | 0 refills | Status: AC | PRN

## 2023-04-23 MED ORDER — PREDNISONE 20 MG PO TABS: ORAL_TABLET | ORAL | 0 refills | Status: AC

## 2023-04-23 NOTE — Discharge Instructions (Signed)
We will manage this as a viral syndrome. For sore throat or cough try using a honey-based tea. Use 3 teaspoons of honey with juice squeezed from half lemon. Place shaved pieces of ginger into 1/2-1 cup of water and warm over stove top. Then mix the ingredients and repeat every 4 hours as needed. Please take Tylenol 500mg -650mg  once every 6 hours for fevers, aches and pains. Hydrate very well with at least 2 liters (80 ounces) of water. Eat light meals such as soups (chicken and noodles, chicken wild rice, vegetable).  Do not eat any foods that you are allergic to.  Start an antihistamine like Zyrtec (10mg  daily) for postnasal drainage, sinus congestion.  You can take this together with prednisone and cough syrup. The cough medication can make you sleepy so if this happens just use it at bedtime.

## 2023-04-23 NOTE — ED Provider Notes (Signed)
[facility]  Note:  This document was prepared using Conservation officer, historic buildings and may include unintentional dictation errors.  MRN: [id number] DOB: 04-Nov-1956  Subjective:   [name] is a 67 y.o. female presenting for 6-day history of persistent sinus congestion, drainage, coughing, chest congestion.  Symptoms are worse overnight and in the morning.  Has to sleep sitting up.  They can clear up some during the day but generally the symptoms have persisted.  Has been using multiple over-the-counter cold medications that do help her overnight.  No smoking of any kind including cigarettes, cigars, vaping, marijuana use.  No history asthma.  Has previously taken prednisone and has done well with this for respiratory symptoms.  No current facility-administered medications for this encounter.  Current Outpatient Medications:    Phenyleph-Doxylamine-DM-APAP (ALKA-SELTZER NIGHT COLD & FLU PO), Take by mouth., Disp: , Rfl:    sertraline (ZOLOFT) 50 MG tablet, TAKE 1/2 TABLET BY MOUTH DAILY WITH FOOD FOR 1 WEEK THEN TAKE 1 TABLET BY MOUTH DAILY, Disp: , Rfl:    clonazePAM (KLONOPIN) 0.5 MG tablet, Take 0.5 mg by mouth daily as needed for anxiety., Disp: , Rfl: 0   ibuprofen (ADVIL) 200 MG tablet, Take 200 mg by mouth every 6 (six) hours as needed., Disp: , Rfl:    levothyroxine (SYNTHROID, LEVOTHROID) 100 MCG tablet, Take 100 mcg by mouth daily before breakfast., Disp: , Rfl:    Multiple Vitamin (MULTIVITAMIN WITH MINERALS) TABS tablet, Take 1 tablet by mouth daily., Disp: , Rfl:    naproxen sodium (ALEVE) 220 MG tablet, Take 220 mg by mouth daily as needed (pain)., Disp: , Rfl:    Allergies  Allergen Reactions   Pollen Extract Other (See Comments)    Past Medical History:  Diagnosis Date   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   Arthritis    Golfer's elbow 2012   Hypothyroidism    Rotator cuff sprain    Thyroid disease      Past Surgical  History:  Procedure Laterality Date   ANKLE SURGERY     screw in left ankle/ 2 surgeries   CESAREAN SECTION  1978    1 time   COLONOSCOPY  2015   HARDWARE REMOVAL Left 12/30/2018   Procedure: HARDWARE REMOVAL LEFT ANKLE;  Surgeon: Myrene Galas, MD;  Location: [facility] OR;  Service: Orthopedics;  Laterality: Left;   MOUTH SURGERY     PARTIAL KNEE ARTHROPLASTY Left 11/09/2015   Procedure: LEFT UNICOMPARTMENTAL KNEE ARTHROPLASTY;  Surgeon: Kathryne Hitch, MD;  Location: [facility];  Service: Orthopedics;  Laterality: Left;   THYROIDECTOMY, PARTIAL  1992   WISDOM TOOTH EXTRACTION  1980    Family History  Problem Relation Age of Onset   Diverticulitis Mother        intestines removed partial   Colon polyps Mother    Diverticulitis Son        intestines removed partial   Colon polyps Son    Colon cancer Neg Hx    Esophageal cancer Neg Hx    Rectal cancer Neg Hx    Stomach cancer Neg Hx     Social History   Tobacco Use   Smoking status: Never   Smokeless tobacco: Never  Vaping Use   Vaping Use: Never used  Substance Use Topics   Alcohol use: Not Currently    Alcohol/week: 7.0 standard drinks of alcohol    Types: 7 Standard drinks or equivalent per week   Drug use:  No    ROS   Objective:   Vitals: BP 135/72 (BP Location: Left Arm)   Pulse 84   Temp 98.8 F (37.1 C) (Oral)   Resp 18   LMP 10/27/2010   SpO2 95%   Physical Exam Constitutional:      General: She is not in acute distress.    Appearance: Normal appearance. She is well-developed and normal weight. She is not ill-appearing, toxic-appearing or diaphoretic.  HENT:     Head: Normocephalic and atraumatic.     Right Ear: Tympanic membrane, ear canal and external ear normal. No drainage or tenderness. No middle ear effusion. There is no impacted cerumen. Tympanic membrane is not erythematous or bulging.     Left Ear: Tympanic membrane, ear canal and external ear normal. No drainage or tenderness.  No middle  ear effusion. There is no impacted cerumen. Tympanic membrane is not erythematous or bulging.     Nose: Congestion present. No rhinorrhea.     Mouth/Throat:     Mouth: Mucous membranes are moist. No oral lesions.     Pharynx: No pharyngeal swelling, oropharyngeal exudate, posterior oropharyngeal erythema or uvula swelling.     Tonsils: No tonsillar exudate or tonsillar abscesses.     Comments: Significant post-nasal drainage overlying pharynx.  Eyes:     General: No scleral icterus.       Right eye: No discharge.        Left eye: No discharge.     Extraocular Movements: Extraocular movements intact.     Right eye: Normal extraocular motion.     Left eye: Normal extraocular motion.     Conjunctiva/sclera: Conjunctivae normal.  Cardiovascular:     Rate and Rhythm: Normal rate and regular rhythm.     Heart sounds: Normal heart sounds. No murmur heard.    No friction rub. No gallop.  Pulmonary:     Effort: Pulmonary effort is normal. No respiratory distress.     Breath sounds: No stridor. No wheezing, rhonchi or rales.  Chest:     Chest wall: No tenderness.  Musculoskeletal:     Cervical back: Normal range of motion and neck supple.  Lymphadenopathy:     Cervical: No cervical adenopathy.  Skin:    General: Skin is warm and dry.  Neurological:     General: No focal deficit present.     Mental Status: She is alert and oriented to person, place, and time.  Psychiatric:        Mood and Affect: Mood normal.        Behavior: Behavior normal.     Assessment and Plan :   PDMP not reviewed this encounter.  1. Viral upper respiratory illness    Discussed antibiotic stewardship.  Patient was agreeable.  Deferred imaging given clear cardiopulmonary exam, hemodynamically stable vital signs.  She did request more aggressive management with prednisone and therefore I will be using 40 mg has a good respiratory boost.  She has typically done better with this as well.  Use supportive care  otherwise for general viral upper respiratory illness.  Counseled patient on potential for adverse effects with medications prescribed/recommended today, ER and return-to-clinic precautions discussed, patient verbalized understanding.    Wallis Bamberg, [place] 04/23/23 1442

## 2023-04-23 NOTE — ED Triage Notes (Signed)
Pot reports cough and congestion x 6 days. Symptoms are worse in the morning.  Alka-seltzer day and night gives some relief.

## 2023-06-07 ENCOUNTER — Other Ambulatory Visit: Payer: Self-pay | Admitting: Obstetrics and Gynecology

## 2023-06-07 DIAGNOSIS — N63 Unspecified lump in unspecified breast: Secondary | ICD-10-CM

## 2023-06-19 ENCOUNTER — Other Ambulatory Visit: Payer: Self-pay | Admitting: Obstetrics and Gynecology

## 2023-06-19 ENCOUNTER — Ambulatory Visit
Admission: RE | Admit: 2023-06-19 | Discharge: 2023-06-19 | Disposition: A | Discharge status: Home / Self Care | Payer: Medicare HMO | Source: Ambulatory Visit | Attending: Obstetrics and Gynecology | Admitting: Obstetrics and Gynecology

## 2023-06-19 DIAGNOSIS — N63 Unspecified lump in unspecified breast: Secondary | ICD-10-CM
